# Patient Record
Sex: Male | Born: 1985 | Race: White | Hispanic: No | Marital: Single | Smoking: Current every day smoker
Health system: Southern US, Community
[De-identification: ages and names within clinical notes are randomized; demographics above are authoritative.]

## PROBLEM LIST (undated history)

## (undated) DIAGNOSIS — K649 Unspecified hemorrhoids: Secondary | ICD-10-CM

## (undated) DIAGNOSIS — J45909 Unspecified asthma, uncomplicated: Secondary | ICD-10-CM

## (undated) HISTORY — PX: OTHER SURGICAL HISTORY: SHX169

## (undated) HISTORY — PX: INNER EAR SURGERY: SHX679

---
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
## 2005-06-18 ENCOUNTER — Emergency Department (HOSPITAL_COMMUNITY): Admission: EM | Admit: 2005-06-18 | Discharge: 2005-06-18 | Payer: Self-pay | Admitting: Emergency Medicine

## 2005-06-19 ENCOUNTER — Emergency Department (HOSPITAL_COMMUNITY): Admission: EM | Admit: 2005-06-19 | Discharge: 2005-06-19 | Payer: Self-pay | Admitting: Emergency Medicine

## 2005-06-21 ENCOUNTER — Emergency Department (HOSPITAL_COMMUNITY): Admission: EM | Admit: 2005-06-21 | Discharge: 2005-06-21 | Payer: Self-pay | Admitting: Emergency Medicine

## 2005-07-05 ENCOUNTER — Emergency Department (HOSPITAL_COMMUNITY): Admission: EM | Admit: 2005-07-05 | Discharge: 2005-07-05 | Payer: Self-pay | Admitting: Emergency Medicine

## 2005-10-19 ENCOUNTER — Emergency Department (HOSPITAL_COMMUNITY): Admission: EM | Admit: 2005-10-19 | Discharge: 2005-10-19 | Payer: Self-pay | Admitting: Emergency Medicine

## 2006-09-20 ENCOUNTER — Emergency Department (HOSPITAL_COMMUNITY): Admission: EM | Admit: 2006-09-20 | Discharge: 2006-09-20 | Payer: Self-pay | Admitting: Emergency Medicine

## 2006-12-19 ENCOUNTER — Emergency Department (HOSPITAL_COMMUNITY): Admission: EM | Admit: 2006-12-19 | Discharge: 2006-12-20 | Payer: Self-pay | Admitting: Emergency Medicine

## 2007-06-05 ENCOUNTER — Emergency Department (HOSPITAL_COMMUNITY): Admission: EM | Admit: 2007-06-05 | Discharge: 2007-06-05 | Payer: Self-pay | Admitting: Emergency Medicine

## 2007-12-21 ENCOUNTER — Emergency Department (HOSPITAL_COMMUNITY): Admission: EM | Admit: 2007-12-21 | Discharge: 2007-12-21 | Payer: Self-pay | Admitting: Emergency Medicine

## 2008-04-04 IMAGING — CT CT MAXILLOFACIAL W/ CM
3 series · 17 of 47 positions shown, 20 images · IV contrast (omnipaque)
Comparison: 05/24/03 CT.

CLINICAL DATA: Right eye periorbital swelling.  Clinical concern for cellulitis.  
MAXILLOFACIAL CT WITH CONTRAST:
TECHNIQUE: Axial and coronal plane CT imaging was performed through the maxillofacial structures following intravenous contrast administration.
Contrast:  100 cc Omnipaque 300.

[Series 4: orbit/facial 2.0 h30s st · axial · 0.34mm/px · z∈[+1004,+1144]mm · 11 of 82 slices shown, 14 images]
[im 6/82  brain]
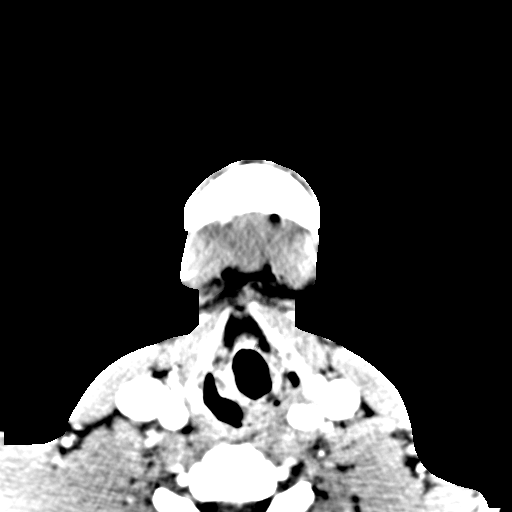
[im 6/82  bone]
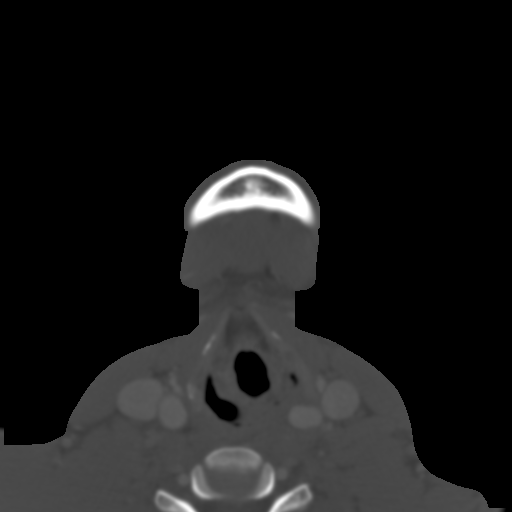
[im 12/82  bone]
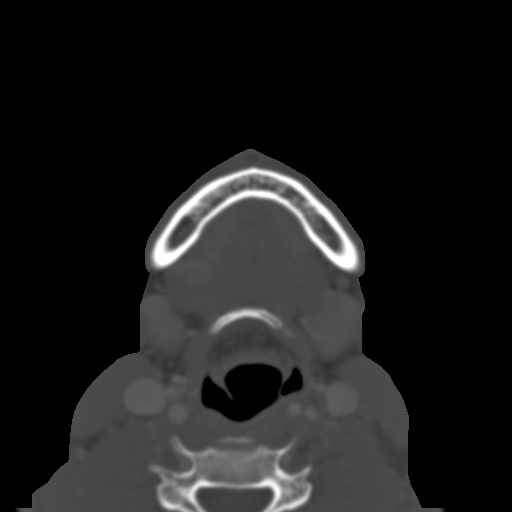
[im 20/82  bone]
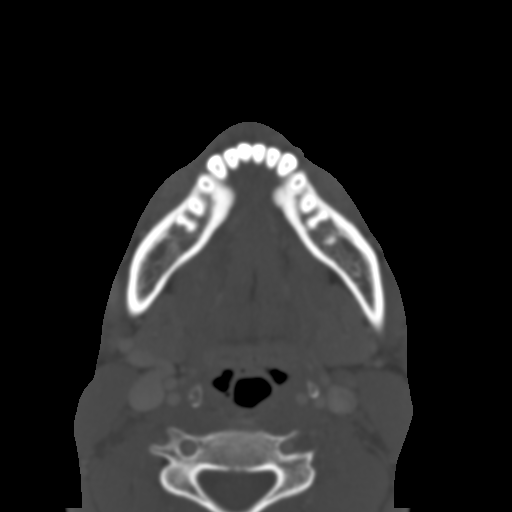
[im 26/82  bone]
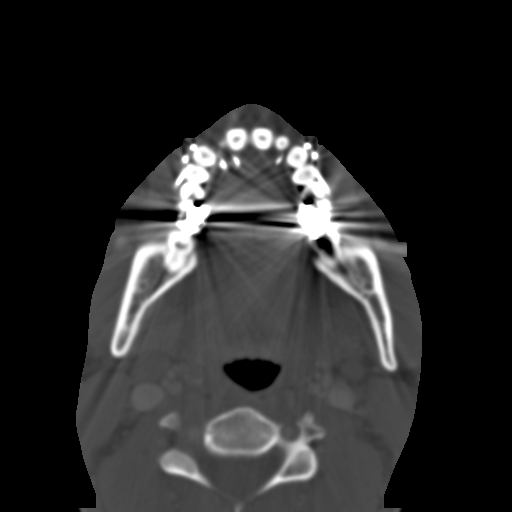
[im 34/82  brain]
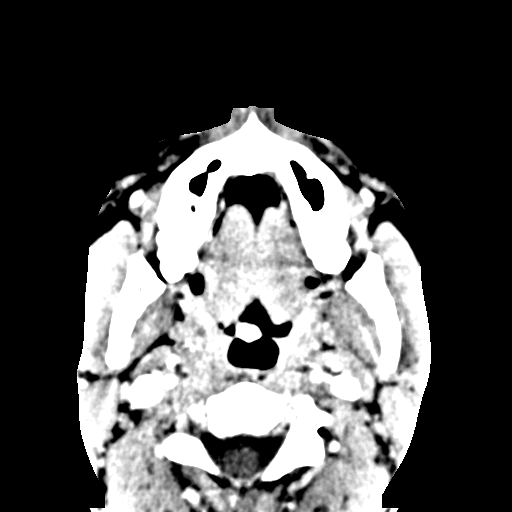
[im 34/82  bone]
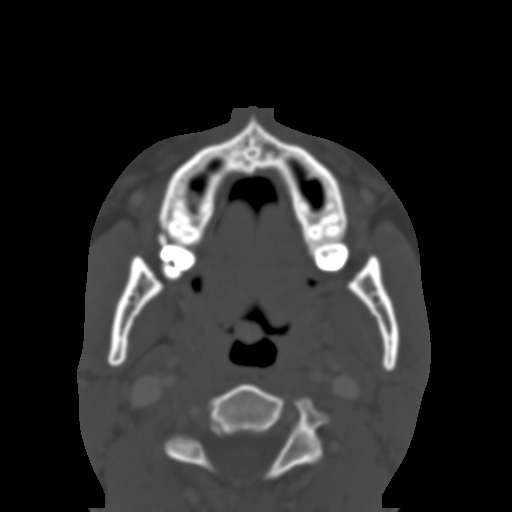
[im 42/82  bone]
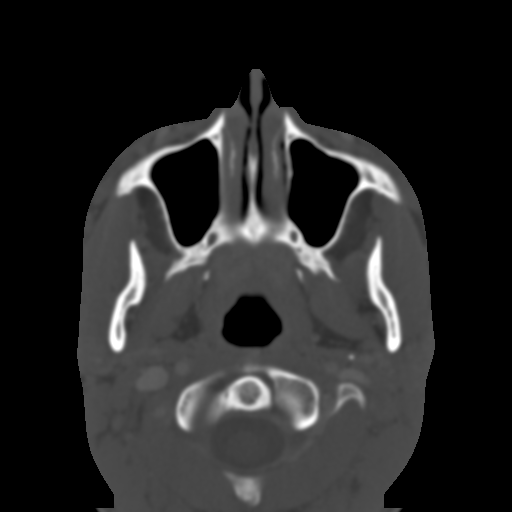
[im 48/82  bone]
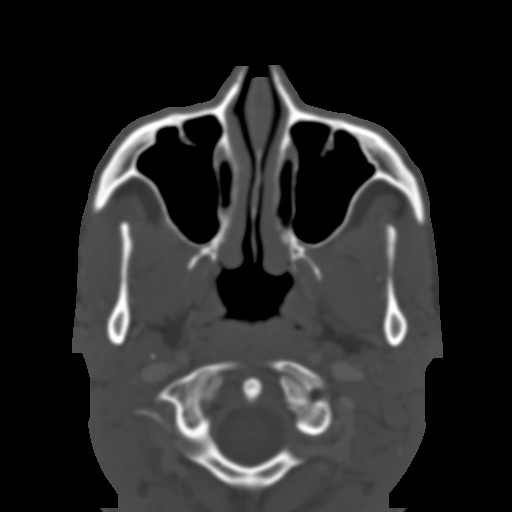
[im 56/82  bone]
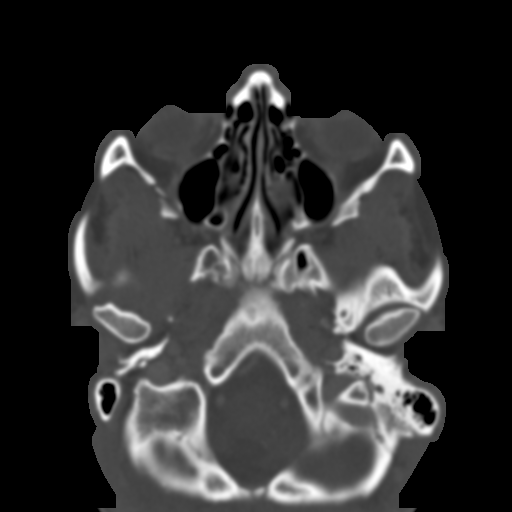
[im 62/82  brain]
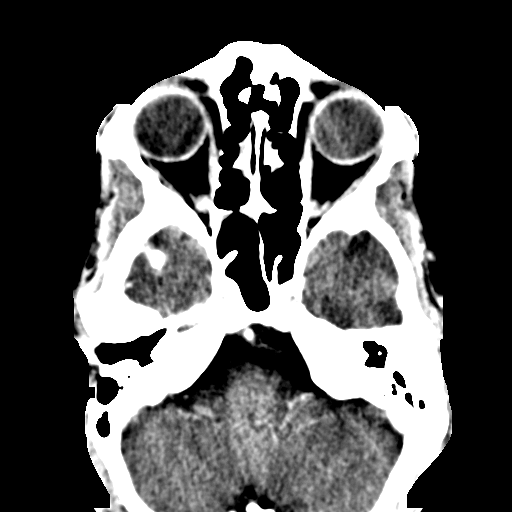
[im 62/82  bone]
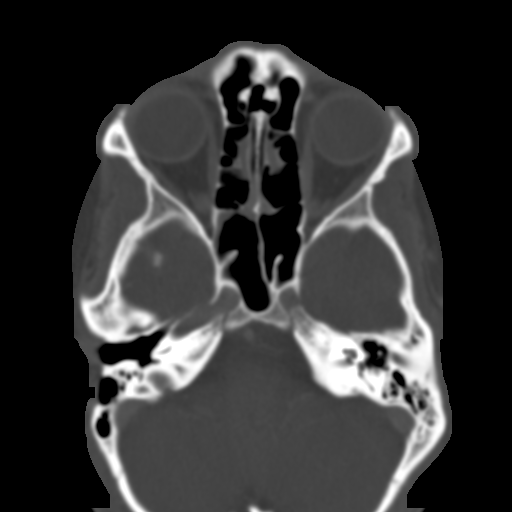
[im 70/82  bone]
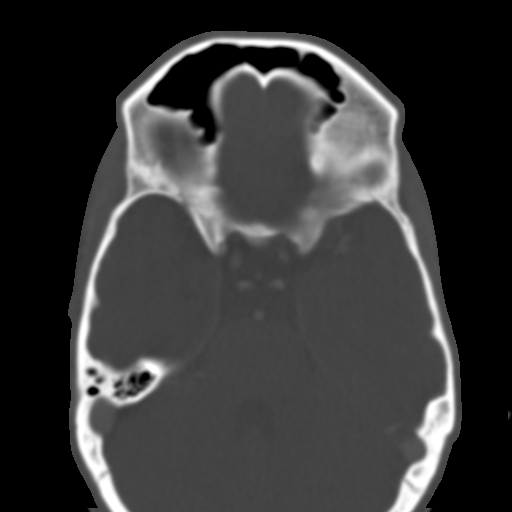
[im 76/82  bone]
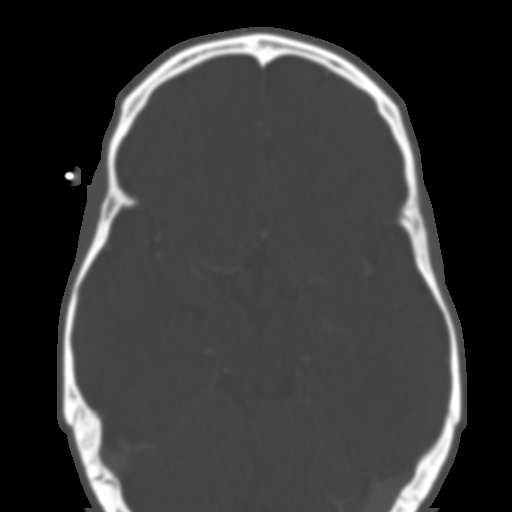

[Series 7: orbit/facial 2.0 spo · coronal · 0.36mm/px · 3 of 77 slices shown]
[im 26/77  bone]
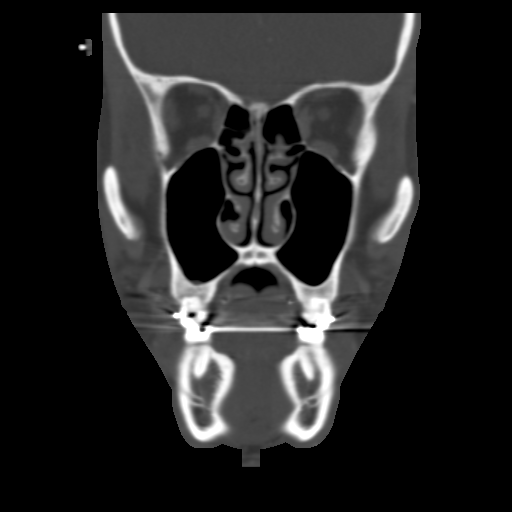
[im 34/77  bone]
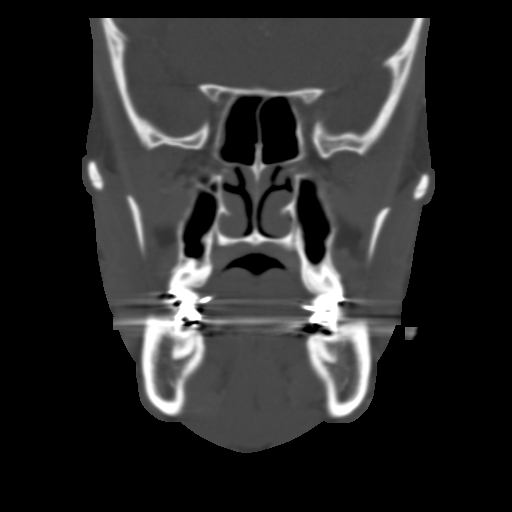
[im 43/77  bone]
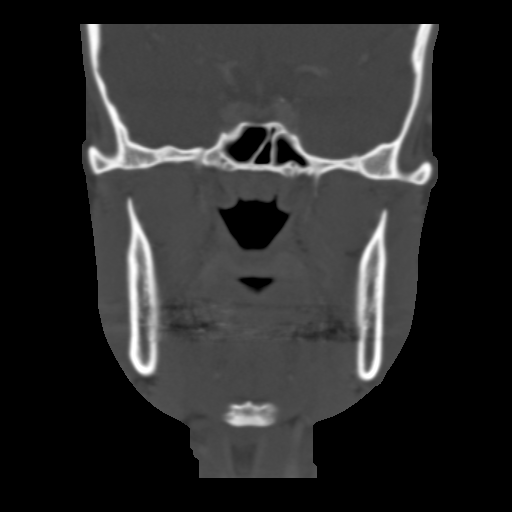

[Series 8: orbit/facial 2.0 spo sag sag · sagittal · 0.35mm/px · 3 of 73 slices shown]
[im 25/73  bone]
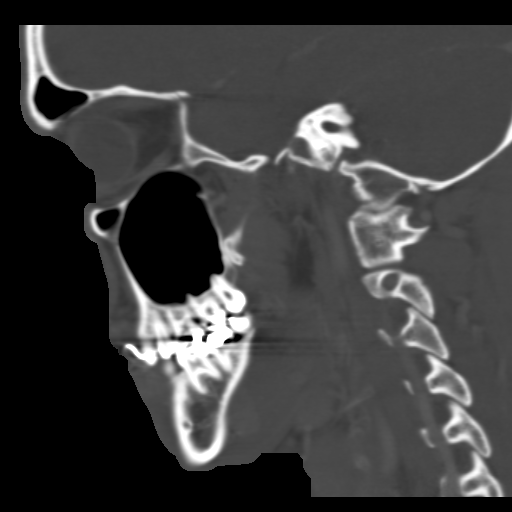
[im 37/73  bone]
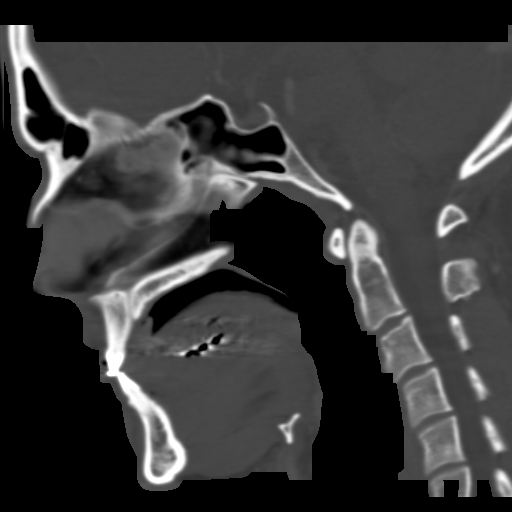
[im 49/73  bone]
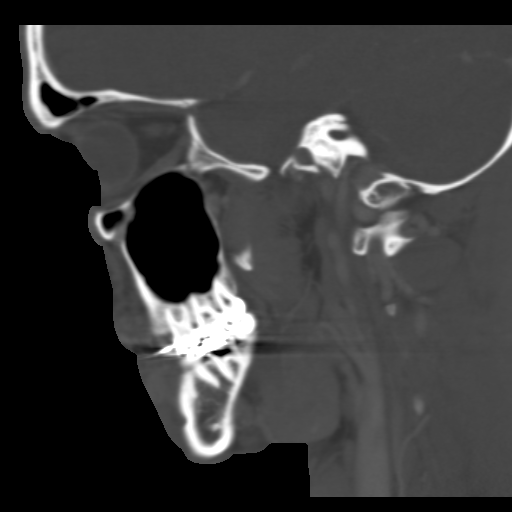

[17 of 47 positions shown; findings below may reference images not displayed]

FINDINGS: Orbits and paranasal sinuses are intact.  In particular, the intraconal fat in both orbits is clean without evidence of inflammation.  Minimal mucoperiosteal thickening of ethmoid and sphenoid sinusitis is noted.  No fracture.
IMPRESSION: 1.  No CT evidence for orbital cellulitis.  
2.  Minimal ethmoid and sphenoid sinusitis.

## 2008-04-12 ENCOUNTER — Emergency Department (HOSPITAL_COMMUNITY): Admission: EM | Admit: 2008-04-12 | Discharge: 2008-04-12 | Payer: Self-pay | Admitting: Family Medicine

## 2008-05-05 ENCOUNTER — Emergency Department (HOSPITAL_COMMUNITY): Admission: EM | Admit: 2008-05-05 | Discharge: 2008-05-05 | Payer: Self-pay | Admitting: Emergency Medicine

## 2010-02-11 ENCOUNTER — Inpatient Hospital Stay (INDEPENDENT_AMBULATORY_CARE_PROVIDER_SITE_OTHER)
Admission: RE | Admit: 2010-02-11 | Discharge: 2010-02-11 | Disposition: A | Discharge status: Home / Self Care | Payer: Self-pay | Source: Ambulatory Visit | Attending: Emergency Medicine | Admitting: Emergency Medicine

## 2010-02-11 DIAGNOSIS — IMO0002 Reserved for concepts with insufficient information to code with codable children: Secondary | ICD-10-CM

## 2010-02-14 LAB — CULTURE, ROUTINE-ABSCESS

## 2010-09-28 LAB — STREP A DNA PROBE: Group A Strep Probe: NEGATIVE

## 2010-10-06 LAB — DIFFERENTIAL
Basophils Relative: 1 % (ref 0–1)
Basophils Relative: 2 — ABNORMAL HIGH
Eosinophils Relative: 1 % (ref 0–5)
Eosinophils Relative: 3
Lymphocytes Relative: 25 % (ref 12–46)
Lymphocytes Relative: 30
Lymphs Abs: 2.8 10*3/uL (ref 0.7–4.0)
Lymphs Abs: 3.1
Monocytes Absolute: 0.8
Neutro Abs: 7.3 10*3/uL (ref 1.7–7.7)

## 2010-10-06 LAB — URINALYSIS, ROUTINE W REFLEX MICROSCOPIC
Bilirubin Urine: NEGATIVE
Glucose, UA: NEGATIVE mg/dL
Hgb urine dipstick: NEGATIVE
Ketones, ur: NEGATIVE mg/dL
Nitrite: NEGATIVE
Protein, ur: NEGATIVE mg/dL
Specific Gravity, Urine: 1.016 (ref 1.005–1.030)
Urobilinogen, UA: 1 mg/dL (ref 0.0–1.0)
pH: 7.5 (ref 5.0–8.0)

## 2010-10-06 LAB — CBC
HCT: 50.4 % (ref 39.0–52.0)
HCT: 45.1
Hemoglobin: 17.7 g/dL — ABNORMAL HIGH (ref 13.0–17.0)
Hemoglobin: 16
MCHC: 35.2 g/dL (ref 30.0–36.0)
MCHC: 35.6
MCV: 89.4 fL (ref 78.0–100.0)
MCV: 87.1
Platelets: 277 10*3/uL (ref 150–400)
Platelets: 260
RBC: 5.64 MIL/uL (ref 4.22–5.81)
RBC: 5.17
RDW: 12.8 % (ref 11.5–15.5)
RDW: 12.6
WBC: 10.9 10*3/uL — ABNORMAL HIGH (ref 4.0–10.5)
WBC: 10.4

## 2010-10-06 LAB — COMPREHENSIVE METABOLIC PANEL
AST: 21
Albumin: 4.2
Alkaline Phosphatase: 61
BUN: 8
Calcium: 9.1 mg/dL (ref 8.4–10.5)
Calcium: 9
Chloride: 104
Creatinine, Ser: 0.89 mg/dL (ref 0.4–1.5)
Creatinine, Ser: 0.84
GFR calc Af Amer: >60 mL/min (ref >60)
GFR calc non Af Amer: >60 mL/min (ref >60)
GFR calc non Af Amer: >60
Glucose, Bld: 84 mg/dL (ref 70–99)
Potassium: 3.8 mEq/L (ref 3.5–5.1)
Sodium: 139 mEq/L (ref 135–145)
Total Bilirubin: 1.4 mg/dL — ABNORMAL HIGH (ref 0.3–1.2)
Total Bilirubin: 1.1
Total Protein: 7 g/dL (ref 6.0–8.3)
Total Protein: 6.5

## 2010-10-12 LAB — DIFFERENTIAL
Eosinophils Absolute: 0.1
Lymphocytes Relative: 7 — ABNORMAL LOW

## 2010-10-12 LAB — URINALYSIS, ROUTINE W REFLEX MICROSCOPIC
Glucose, UA: NEGATIVE
Hgb urine dipstick: NEGATIVE
Protein, ur: NEGATIVE
Specific Gravity, Urine: 1.023

## 2010-10-12 LAB — CBC
MCHC: 35.7
MCV: 87.1
RBC: 5.06

## 2010-10-12 LAB — COMPREHENSIVE METABOLIC PANEL
Alkaline Phosphatase: 59
CO2: 23
Creatinine, Ser: 0.94
Total Bilirubin: 1.8 — ABNORMAL HIGH
Total Protein: 6.6

## 2010-12-27 ENCOUNTER — Emergency Department (HOSPITAL_COMMUNITY)
Admission: EM | Admit: 2010-12-27 | Discharge: 2010-12-27 | Disposition: A | Discharge status: Home / Self Care | Payer: Self-pay | Source: Home / Self Care | Attending: Family Medicine | Admitting: Family Medicine

## 2010-12-27 DIAGNOSIS — J069 Acute upper respiratory infection, unspecified: Secondary | ICD-10-CM

## 2010-12-27 MED ORDER — GUAIFENESIN-CODEINE 100-10 MG/5ML PO SYRP: 5.0000 mL | ORAL_SOLUTION | Freq: Four times a day (QID) | ORAL | Status: AC | PRN

## 2010-12-27 MED ORDER — AZITHROMYCIN 250 MG PO TABS: 250.0000 mg | ORAL_TABLET | Freq: Every day | ORAL | Status: AC

## 2010-12-27 NOTE — ED Provider Notes (Signed)
History     CSN: [id number]  Arrival date & time 12/27/10  1015   First MD Initiated Contact with Patient 12/27/10 1031      Chief Complaint  Patient presents with  . Fever    (Consider location/radiation/quality/duration/timing/severity/associated sxs/prior treatment) HPI Comments: George Mason presents for evaluation of persistent cough, fever, head congestion, rhinorrhea. He smokes less than a pack of cigarettes daily.   Patient is a 25 y.o. male presenting with cough. The history is provided by the patient.  Cough This is a new problem. The current episode started more than 2 days ago. The problem occurs constantly. The cough is non-productive. The maximum temperature recorded prior to his arrival was 100 to 100.9 F. Associated symptoms include chest pain, ear congestion, headaches, rhinorrhea and myalgias. Pertinent negatives include no chills and no sore throat. He has tried decongestants for the symptoms. The treatment provided no relief. He is a smoker.    History reviewed. No pertinent past medical history.  History reviewed. No pertinent past surgical history.  No family history on file.  History  Substance Use Topics  . Smoking status: Current Everyday Smoker  . Smokeless tobacco: Not on file  . Alcohol Use: No      Review of Systems  Constitutional: Positive for fever. Negative for chills.  HENT: Positive for congestion and rhinorrhea. Negative for sore throat.   Eyes: Negative.   Respiratory: Positive for cough.   Cardiovascular: Positive for chest pain.  Gastrointestinal: Negative.   Genitourinary: Negative.   Musculoskeletal: Positive for myalgias.  Skin: Negative.   Neurological: Positive for headaches.    Allergies  Penicillins  Home Medications   Current Outpatient Rx  Name Route Sig Dispense Refill  . AZITHROMYCIN 250 MG PO TABS Oral Take 1 tablet (250 mg total) by mouth daily. Take two tablets on first day, then one tablet each day for four days  6 tablet 0  . GUAIFENESIN-CODEINE 100-10 MG/5ML PO SYRP Oral Take 5 mLs by mouth every 6 (six) hours as needed for cough or congestion. 120 mL 0    BP 152/103  Pulse 74  Temp(Src) 98.8 F (37.1 C) (Oral)  Resp 20  SpO2 99%  Physical Exam  Nursing note and vitals reviewed. Constitutional: He is oriented to person, place, and time. He appears well-developed and well-nourished.  HENT:  Head: Normocephalic and atraumatic.  Right Ear: Tympanic membrane and external ear normal.  Left Ear: Tympanic membrane and external ear normal.  Mouth/Throat: Uvula is midline, oropharynx is clear and moist and mucous membranes are normal. No oropharyngeal exudate, posterior oropharyngeal edema or posterior oropharyngeal erythema.  Eyes: Conjunctivae and EOM are normal. Pupils are equal, round, and reactive to light.  Neck: Normal range of motion.  Cardiovascular: Normal rate and regular rhythm.   Pulmonary/Chest: Effort normal and breath sounds normal. He has no wheezes. He has no rhonchi. He has no rales.  Neurological: He is alert and oriented to person, place, and time.  Skin: Skin is warm and dry.    ED Course  Procedures (including critical care time)  Labs Reviewed - No data to display No results found.   1. URI (upper respiratory infection)       MDM  Treated with z-pack and guaifenesin AC        George Priest, MD 12/27/10 1321

## 2010-12-27 NOTE — ED Notes (Signed)
C/o 3 day duration of cough, body aches, chest tightness; hist of asthma as child, no wheezing noted on ascultation

## 2011-09-01 ENCOUNTER — Emergency Department (INDEPENDENT_AMBULATORY_CARE_PROVIDER_SITE_OTHER)
Admission: EM | Admit: 2011-09-01 | Discharge: 2011-09-01 | Disposition: A | Discharge status: Home / Self Care | Payer: Self-pay | Source: Home / Self Care | Attending: Emergency Medicine | Admitting: Emergency Medicine

## 2011-09-01 ENCOUNTER — Encounter (HOSPITAL_COMMUNITY): Payer: Self-pay | Admitting: Emergency Medicine

## 2011-09-01 DIAGNOSIS — R0982 Postnasal drip: Secondary | ICD-10-CM

## 2011-09-01 DIAGNOSIS — J302 Other seasonal allergic rhinitis: Secondary | ICD-10-CM

## 2011-09-01 DIAGNOSIS — J309 Allergic rhinitis, unspecified: Secondary | ICD-10-CM

## 2011-09-01 MED ORDER — FLUTICASONE PROPIONATE 50 MCG/ACT NA SUSP: 2.0000 | Freq: Every day | NASAL | Status: DC

## 2011-09-01 MED ORDER — IBUPROFEN 600 MG PO TABS: 600.0000 mg | ORAL_TABLET | Freq: Four times a day (QID) | ORAL | Status: AC | PRN

## 2011-09-01 MED ORDER — FEXOFENADINE-PSEUDOEPHED ER 180-240 MG PO TB24: 1.0000 | ORAL_TABLET | Freq: Every day | ORAL | Status: AC

## 2011-09-01 NOTE — ED Notes (Signed)
Pt c/o dry cough, headache, body ache, congestion, sore throat and pain on left ear x1 week... Denies fevers, vomiting, nausea and diarrhea.

## 2011-09-03 NOTE — ED Provider Notes (Signed)
History     CSN: [id number]  Arrival date & time 09/01/11  3086   First MD Initiated Contact with Patient 09/01/11 1956      Chief Complaint  Patient presents with  . Cough    (Consider location/radiation/quality/duration/timing/severity/associated sxs/prior treatment) HPI Comments: Patient reports sneezing, itchy, watery eyes, nasal congestion, clear rhinorrhea, malaise for the past week. Reports some left ear fullness. No nausea, vomiting, fevers, rash. Has a history of seasonal allergies. He is a smoker.  ROS as noted in HPI. All other ROS negative.   Patient is a 26 y.o. male presenting with cough. No language interpreter was used.  Cough This is a new problem. The current episode started more than 2 days ago. The problem occurs every few hours. The problem has not changed since onset.The cough is non-productive. Associated symptoms include ear congestion, ear pain, rhinorrhea, sore throat and eye redness. Pertinent negatives include no chest pain, no chills, no myalgias, no shortness of breath and no wheezing. He has tried decongestants for the symptoms. The treatment provided no relief. He is a smoker. His past medical history does not include asthma.    History reviewed. No pertinent past medical history.  Past Surgical History  Procedure Date  . Inner ear surgery     History reviewed. No pertinent family history.  History  Substance Use Topics  . Smoking status: Current Everyday Smoker -- 0.5 packs/day  . Smokeless tobacco: Not on file  . Alcohol Use: Yes      Review of Systems  Constitutional: Negative for chills.  HENT: Positive for ear pain, sore throat and rhinorrhea.   Eyes: Positive for redness.  Respiratory: Positive for cough. Negative for shortness of breath and wheezing.   Cardiovascular: Negative for chest pain.  Musculoskeletal: Negative for myalgias.    Allergies  Penicillins  Home Medications   Current Outpatient Rx  Name Route Sig  Dispense Refill  . ACETAMINOPHEN 325 MG PO TABS Oral Take 650 mg by mouth every 6 (six) hours as needed.    Marland Kitchen FEXOFENADINE-PSEUDOEPHED ER 180-240 MG PO TB24 Oral Take 1 tablet by mouth daily. 14 tablet 0  . FLUTICASONE PROPIONATE 50 MCG/ACT NA SUSP Nasal Place 2 sprays into the nose daily. 16 g 0  . IBUPROFEN 600 MG PO TABS Oral Take 1 tablet (600 mg total) by mouth every 6 (six) hours as needed for pain. 30 tablet 0    BP 128/82  Pulse 60  Temp 97.9 F (36.6 C) (Oral)  Resp 20  SpO2 100%  Physical Exam  Nursing note and vitals reviewed. Constitutional: He is oriented to person, place, and time. He appears well-developed and well-nourished.  HENT:  Head: Normocephalic and atraumatic. No trismus in the jaw.  Right Ear: Tympanic membrane normal.  Left Ear: Tympanic membrane normal.  Nose: Mucosal edema and rhinorrhea present.  Mouth/Throat: Uvula is midline and mucous membranes are normal. Posterior oropharyngeal erythema present. No oropharyngeal exudate.       Cobblestoned oropharynx, postnasal drip. No sinus tenderness  Eyes: Conjunctivae and EOM are normal. Pupils are equal, round, and reactive to light.  Neck: Normal range of motion. Neck supple.  Cardiovascular: Normal rate, regular rhythm and normal heart sounds.   Pulmonary/Chest: Effort normal and breath sounds normal.  Abdominal: Bowel sounds are normal. He exhibits no distension.  Musculoskeletal: Normal range of motion.  Lymphadenopathy:    He has no cervical adenopathy.  Neurological: He is alert and oriented to person, place, and time. No  cranial nerve deficit.  Skin: Skin is warm and dry. No rash noted.  Psychiatric: He has a normal mood and affect. His behavior is normal. Judgment and thought content normal.    ED Course  Procedures (including critical care time)  Labs Reviewed - No data to display No results found.   1. Seasonal allergies   2. Postnasal drip       MDM  Presentation consistent with  seasonal allergies. Home with nonsedating antihistamines, Flonase, saline nasal irrigation. Providing primary care resources for followup as needed.  Luiz Blare, MD 09/03/11 2233

## 2013-04-03 ENCOUNTER — Emergency Department (INDEPENDENT_AMBULATORY_CARE_PROVIDER_SITE_OTHER)
Admission: EM | Admit: 2013-04-03 | Discharge: 2013-04-03 | Disposition: A | Discharge status: Home / Self Care | Payer: Self-pay | Source: Home / Self Care | Attending: Emergency Medicine | Admitting: Emergency Medicine

## 2013-04-03 ENCOUNTER — Encounter (HOSPITAL_COMMUNITY): Payer: Self-pay | Admitting: Emergency Medicine

## 2013-04-03 DIAGNOSIS — K649 Unspecified hemorrhoids: Secondary | ICD-10-CM

## 2013-04-03 MED ORDER — HYDROCORTISONE ACETATE 25 MG RE SUPP: 25.0000 mg | Freq: Two times a day (BID) | RECTAL | Status: DC

## 2013-04-03 NOTE — Discharge Instructions (Signed)
You have bleeding from a hemorrhoid that is on the inside. You also have an external hemorrhoid, that may have bled some, but most appears to be from the inside.   For this you need to do the following things: 1. Use a steroid suppository twice a day for the next 3 days to decrease inflammation 2. Prevent constipation - drink clear fluids, use a daily fiber supplement, this softener like Colace as needed 3. Do not lift anything greater than 20 pounds for the next week  Please follow-up with this returns.   Dr. Clinton SawyerWilliamson

## 2013-04-03 NOTE — ED Notes (Signed)
States he noted bright blood from rectum earlier today; denies trauma or hard stool

## 2013-04-03 NOTE — ED Provider Notes (Signed)
Medical screening examination/treatment/procedure(s) were performed by a resident physician and as supervising physician I was immediately available for consultation/collaboration.  Leslee Homeavid Darnesha Diloreto, M.D.  Reuben Likesavid C Janel Beane, MD 04/03/13 [phone]

## 2013-04-03 NOTE — ED Provider Notes (Signed)
CSN: [id number]     Arrival date & time 04/03/13  1858 History   First MD Initiated Contact with Patient 04/03/13 1933     Chief Complaint  Patient presents with  . Rectal Bleeding   (Consider location/radiation/quality/duration/timing/severity/associated sxs/prior Treatment) Patient is a 28 y.o. male presenting with hematochezia.  Rectal Bleeding  Rectal bleeding. This started suddenly 3 hours ago. It occurred while he was doing abdominal crunches at the gym. It was painless. He described as a large amount. This is the first time he's had a problem with rectal bleeding. He denies any preceding abdominal pain, nausea, vomiting or diarrhea. He notes that he has had some constipation recently he does not have a bowel movement every day. He denies any rectal instrumentation.  Medical history - denies a history of Crohn's disease or celiac disease  History reviewed. No pertinent past medical history. Past Surgical History  Procedure Laterality Date  . Inner ear surgery     History reviewed. No pertinent family history. History  Substance Use Topics  . Smoking status: Current Every Day Smoker -- 0.50 packs/day  . Smokeless tobacco: Not on file  . Alcohol Use: Yes    Review of Systems  Gastrointestinal: Positive for hematochezia.  negative for fever, chills, nausea, vomiting, abdominal pain  Allergies  Penicillins  Home Medications   Current Outpatient Rx  Name  Route  Sig  Dispense  Refill  . acetaminophen (TYLENOL) 325 MG tablet   Oral   Take 650 mg by mouth every 6 (six) hours as needed.         Marland Kitchen. EXPIRED: fluticasone (FLONASE) 50 MCG/ACT nasal spray   Nasal   Place 2 sprays into the nose daily.   16 g   0   . hydrocortisone (ANUSOL-HC) 25 MG suppository   Rectal   Place 1 suppository (25 mg total) rectally 2 (two) times daily.   12 suppository   0    BP 139/85  Pulse 90  Temp(Src) 98.5 F (36.9 C) (Oral)  Resp 14  SpO2 100% Physical Exam Gen. A  well-appearing white male, mildly distressed Abdomen: nondistended nontender Rectal exam: one external hemorrhoid on the superior side with small hematoma that is nonbleeding, nontender Anoscopy: bleeding internal hemorrhoid in the inferior surface  ED Course  Procedures (including critical care time) Labs Review Labs Reviewed - No data to display Imaging Review No results found.   MDM   1. Bleeding hemorrhoid    Pt needs to prevent this with use of fiber and fluids to decrease constipation. Hydrocortisone suppositories also prescribed for potential improvement. Recommended to avoid strenuous activity for 1 week. Return PRN.     Garnetta BuddyEdward V Scicchitano, MD 04/03/13 2012

## 2013-09-05 ENCOUNTER — Encounter (HOSPITAL_BASED_OUTPATIENT_CLINIC_OR_DEPARTMENT_OTHER): Payer: Self-pay | Admitting: Emergency Medicine

## 2013-09-05 ENCOUNTER — Emergency Department (HOSPITAL_BASED_OUTPATIENT_CLINIC_OR_DEPARTMENT_OTHER)
Admission: EM | Admit: 2013-09-05 | Discharge: 2013-09-05 | Disposition: A | Discharge status: Home / Self Care | Payer: No Typology Code available for payment source | Attending: Emergency Medicine | Admitting: Emergency Medicine

## 2013-09-05 ENCOUNTER — Emergency Department (HOSPITAL_BASED_OUTPATIENT_CLINIC_OR_DEPARTMENT_OTHER): Payer: No Typology Code available for payment source

## 2013-09-05 DIAGNOSIS — Z880 Allergy status to penicillin: Secondary | ICD-10-CM | POA: Insufficient documentation

## 2013-09-05 DIAGNOSIS — Y9389 Activity, other specified: Secondary | ICD-10-CM | POA: Insufficient documentation

## 2013-09-05 DIAGNOSIS — S335XXA Sprain of ligaments of lumbar spine, initial encounter: Principal | ICD-10-CM

## 2013-09-05 DIAGNOSIS — Y92410 Unspecified street and highway as the place of occurrence of the external cause: Secondary | ICD-10-CM | POA: Insufficient documentation

## 2013-09-05 DIAGNOSIS — F17200 Nicotine dependence, unspecified, uncomplicated: Secondary | ICD-10-CM | POA: Insufficient documentation

## 2013-09-05 DIAGNOSIS — S39012A Strain of muscle, fascia and tendon of lower back, initial encounter: Secondary | ICD-10-CM

## 2013-09-05 DIAGNOSIS — IMO0002 Reserved for concepts with insufficient information to code with codable children: Secondary | ICD-10-CM | POA: Insufficient documentation

## 2013-09-05 DIAGNOSIS — S339XXA Sprain of unspecified parts of lumbar spine and pelvis, initial encounter: Secondary | ICD-10-CM | POA: Insufficient documentation

## 2013-09-05 MED ORDER — IBUPROFEN 800 MG PO TABS
800.0000 mg | ORAL_TABLET | Freq: Once | ORAL | Status: AC
  Administered 2013-09-05: 800 mg via ORAL
  Filled 2013-09-05: qty 1

## 2013-09-05 MED ORDER — METHOCARBAMOL 500 MG PO TABS: 500.0000 mg | ORAL_TABLET | Freq: Two times a day (BID) | ORAL | Status: DC | PRN

## 2013-09-05 NOTE — Discharge Instructions (Signed)
For pain control you may take up to 800mg of Motrin (also known as ibuprofen). That is usually 4 over the counter pills,  3 times a day. Take with food to minimize stomach irritation  ° °You can also take  tylenol (acetaminophen) 975mg (this is 3 over the counter pills) four times a day. Do not drink alcohol or combine with other medications that have acetaminophen as an ingredient (Read the labels!).   ° °For breakthrough pain you may take Robaxin. Do not drink alcohol, drive or operate heavy machinery when taking Robaxin. ° °Do not hesitate to return to the emergency room for any new, worsening or concerning symptoms. ° °Please obtain primary care using resource guide below. But the minute you were seen in the emergency room and that they will need to obtain records for further outpatient management. ° ° °Emergency Department Resource Guide °1) Find a Doctor and Pay Out of Pocket °Although you won't have to find out who is covered by your insurance plan, it is a good idea to ask around and get recommendations. You will then need to call the office and see if the doctor you have chosen will accept you as a new patient and what types of options they offer for patients who are self-pay. Some doctors offer discounts or will set up payment plans for their patients who do not have insurance, but you will need to ask so you aren't surprised when you get to your appointment. ° °2) Contact Your Local Health Department °Not all health departments have doctors that can see patients for sick visits, but many do, so it is worth a call to see if yours does. If you don't know where your local health department is, you can check in your phone book. The CDC also has a tool to help you locate your state's health department, and many state websites also have listings of all of their local health departments. ° °3) Find a Walk-in Clinic °If your illness is not likely to be very severe or complicated, you may want to try a walk in  clinic. These are popping up all over the country in pharmacies, drugstores, and shopping centers. They're usually staffed by nurse practitioners or physician assistants that have been trained to treat common illnesses and complaints. They're usually fairly quick and inexpensive. However, if you have serious medical issues or chronic medical problems, these are probably not your best option. ° °No Primary Care Doctor: °- Call Health Connect at  832-8000 - they can help you locate a primary care doctor that  accepts your insurance, provides certain services, etc. °- Physician Referral Service- 1-800-533-3463 ° °Chronic Pain Problems: °Organization         Address  Phone   Notes  ° [facility]  (336) 297-2271 Patients need to be referred by their primary care doctor.  ° °Medication Assistance: °Organization         Address  Phone   Notes  °[place] [facility] [address] °[place], [place] [address] (336) 641-8030 --Must be a resident of [place] °-- Must have NO insurance coverage whatsoever (no Medicaid/ Medicare, etc.) °-- The pt. MUST have a primary care doctor that directs their care regularly and follows them in the community °  °[facility]  (866) 331-1348   °[employer]  (888) 892-1162   ° °Agencies that provide inexpensive medical care: °Organization         Address  Phone     Notes  °[facility]  (336) 832-8035   °[facility]    (336) 832-7272   °[facility] [address] °[place], [place] [address] (336) 832-4777   °[facility] [address], °[place] (336) 271-4999   °[facility]    (336) 373-0678   °[facility]    (336) 272-1050   °[facility] ° [address], [place] Phone:  (336) 832-4444, Fax:  (336) 832-4440 Hours of Operation:  9 am - 6 pm, M-F.  Also accepts Medicaid/Medicare and self-pay.  °[facility] ° [address], [place] Phone: (336) [phone], Fax: (336) [phone]. Hours of Operation:  8:30 am - 5:30 pm, M-F.  Also accepts Medicaid and self-pay.  °[facility] [place] [address], [place] Phone: (336) 878-6027   °[facility] [address], [place], [place] (336)723-1848, Ext. 123 Mondays & Thursdays: 7-9 AM.  First 15 patients are seen on a first come, first serve basis. °  ° °Medicaid-accepting [place] Providers: ° °Organization         Address  Phone   Notes  °[facility] [address], [place] ([phone]) [phone] Also accepts self-pay patients.  °[facility] [address], [place] ° (336) 856-9996   °[facility] [address], [place] (336) 288-8857   °[facility] [address] [place] [address], [place] (336) 299-7000   °Veita Bland [address], [place]  ° (336) 373-1557 Only accepts [place] Access Medicaid patients after they have their name applied to their card.  ° °Self-Pay (no insurance) in [place]: ° °Organization         Address  Phone   Notes  °Sickle Cell Patients, [facility] [address], [place] (336) 832-1970   °[facility] [address], [place] (336) 832-4400   ° [facility] ° [address], [place] (336) 992-4800   °[facility]/Dr. Osei-Bonsu ° [address] [place] [address], [place] or [address], [place] (336) [phone] Phone number for both [place] and [place] locations is the same.  °[facility] [address], [place] (336) [phone]   °[facility] [address] [place] [address], [place] or [address] (336) 852-7530 °(336) 878-2260   °[facility] [address],  (336) 350-1642, phone; (336) 294-5005, fax Sees patients  1st and 3rd Saturday of every month.  Must not qualify for public or private insurance (i.e. Medicaid, Medicare, [place] Health Choice, Veterans' Benefits) • Household income should be no more than 200% of the poverty level •The clinic cannot treat you if you are pregnant or think you are pregnant • Sexually transmitted diseases are not treated at the clinic.  ° ° °Dental Care: °Organization         Address  Phone  Notes  °[place] [facility] [address],  (336) 641-6152 Accepts children up to age 21 who are enrolled in Medicaid or [place] Health Choice; pregnant women with a Medicaid card; and children who have applied for Medicaid or [place] Health Choice, but were declined, whose parents can pay a reduced fee at time of service.  °[place]   [facility]  [address], [place] [phone] Accepts children up to age 36 who are enrolled in [place] or [place]; pregnant women with a Medicaid card; and children who have applied for Medicaid or [place] Health Choice, but were declined, whose parents can pay a reduced fee at time of service.  [facility]  [place] [phone] Patients are seen by appointment only. Walk-ins are not accepted. [place] will see patients 32 years of age and older. Monday - Tuesday (8am-5pm) Most Wednesdays (8:30-5pm) $30 per visit, cash only  [facility]  [address], [facility] [phone] Patients are seen by appointment only. Walk-ins are not accepted. [place] will see patients 82 years of age and older. One Wednesday Evening (Monthly: Volunteer Based).  $30 per visit, cash only  [place]  [phone] for adults; Children under age 20, call Graduate Pediatric Dentistry at [phone]. Children aged 74-14, please call [phone] to request a  pediatric application.  Dental services are provided in all areas of dental care including fillings, crowns and bridges, complete and partial dentures, implants, gum treatment, root canals, and extractions. Preventive care is also provided. Treatment is provided to both adults and children. Patients are selected via a lottery and there is often a waiting list.   [facility] [address], [place]  [phone] [email or web address]   [facility] [address] [place], [place] [phone], Ext. 123 Second and Fourth Thursday of each month, opens at 6:30 AM; Clinic ends at 9 AM.  Patients are seen on a first-come first-served basis, and a limited number are seen during each clinic.   [facility]  [address] Hillard Danker [place], [place] [phone]   Eligibility Requirements You must have lived in [place], [place], or [place] counties for at least the last three months.   You cannot be eligible for state or federal sponsored [employer], including [employer], [place], or [employer].   You generally cannot be eligible for healthcare insurance through your employer.    How to apply: Eligibility screenings are held every Tuesday and Wednesday afternoon from 1:00 pm until 4:00 pm. You do not need an appointment for the interview!  [facility] [address], [place], [place]   [place]  [place] Department  [place]  [phone]    Behavioral Health Resources in the Community: Intensive Outpatient Programs Organization         Address  Phone  Notes  [facility] [address], [place], [place] [phone]) [phone]   [facility] [address] [place], [place]   [facility] [address] [place], [place]   [place] [address],  [place], [place] or (217)387-1286   Substance Abuse Resources Organization         Address  Phone  Notes  [facility]  867-450-3460   [place]  [phone]   [facility] [place]   Chinita Pester  [phone]   Residential & Outpatient Substance Abuse Program  (479)725-1356   Psychological Services Organization         Address  Phone  Notes  [facility]  [phone]   [facility]  5647971705   [facility] [address], [place] (870) 170-8020 or (985) 703-9650    Mobile Crisis Teams Organization         Address  Phone  Notes  [facility], Mobile Crisis Care Unit  (216) 218-5144   [facility]  [address] [place], [place] [phone]   Doristine Locks [address] [place] [phone]    Self-Help/Support Groups Organization         Address  Phone             Notes  [facility] [place] - variety of support groups  336- I7437963 Call for more information  Narcotics Anonymous (NA), [facility] [address], [employer] [place]  2 meetings at this location   Statistician         Address  Phone  Notes  [facility] [address],    [place]  [phone]   [facility]  [address], [place] [address], [place], [place] [phone]   [facility] [address] [place], [place] [phone] Admissions: 8am-3pm M-F  [facility] [address],    [place], [place] [phone]   [facility] [address] [place], [place], [place] [phone]   [facility] [address]  [place], [place] [phone]   [facility] - Intensive Outpatient [address], [place], [place] [phone]   [facility] [address]    [place], [place] [phone] or [phone]   [facility] ([facility]) [address] [place], [place] [phone] Accepts Medicaid  [facility] [address],  [place] [phone] Substance Abuse/Addiction Treatment   [facility] Organization         Address  Phone  Notes  [facility]  (604)[phone]   Angie Fava, PhD [address] Ervin Knack [place], [place]   [phone] or 250-065-6972   [facility]   [address] [place], [place] [phone]   [facility] [address] [place], [place] [phone] Insurance/Medicaid/sponsorship through [facility] and Families [address]                                    [place], [place] [phone] Therapy/tele-psych/case  [facility] [address], [place] [phone]    Dr. Lolly Mustache  [phone]   [facility]  [employer] [facility] Dept. 1) [address], [place] 2) [address], [place] 3)  [address], [place] ([phone]) [phone]  ([phone]   [facility] Child Abuse Hotline 772-541-0977 or (939)024-8358 (After Hours)       Motor Vehicle Collision It is common to have multiple bruises and sore muscles after a motor vehicle collision (MVC). These tend to feel worse for the first 24 hours. You may have the most stiffness and soreness over the first several hours. You may also feel worse when you wake up the first morning after your collision. After this point, you will usually begin to improve with each day. The speed of improvement often depends on the severity of the collision, the number of  injuries, and the location and nature of these injuries. HOME CARE INSTRUCTIONS  Put ice on the injured area.  Put ice in a plastic bag.  Place a towel between your skin and the bag.  Leave the ice on for 15-20 minutes, 3-4 times a day, or as  directed by your health care provider.  Drink enough fluids to keep your urine clear or pale yellow. Do not drink alcohol.  Take a warm shower or bath once or twice a day. This will increase blood flow to sore muscles.  You may return to activities as directed by your caregiver. Be careful when lifting, as this may aggravate neck or back pain.  Only take over-the-counter or prescription medicines for pain, discomfort, or fever as directed by your caregiver. Do not use aspirin. This may increase bruising and bleeding. SEEK IMMEDIATE MEDICAL CARE IF:  You have numbness, tingling, or weakness in the arms or legs.  You develop severe headaches not relieved with medicine.  You have severe neck pain, especially tenderness in the middle of the back of your neck.  You have changes in bowel or bladder control.  There is increasing pain in any area of the body.  You have shortness of breath, light-headedness, dizziness, or fainting.  You have chest pain.  You feel sick to your stomach (nauseous), throw up (vomit), or sweat.  You have increasing abdominal discomfort.  There is blood in your urine, stool, or vomit.  You have pain in your shoulder (shoulder strap areas).  You feel your symptoms are getting worse. MAKE SURE YOU:  Understand these instructions.  Will watch your condition.  Will get help right away if you are not doing well or get worse. Document Released: 12/18/2004 Document Revised: 05/04/2013 Document Reviewed: 05/17/2010 [facility] Patient Information 2015 [place], [place]. This information is not intended to replace advice given to you by your health care provider. Make sure you discuss any questions you have with your health care provider.
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
## 2013-09-05 NOTE — ED Notes (Signed)
Patient discharged to home with self. NAD.

## 2013-09-05 NOTE — ED Provider Notes (Signed)
Medical screening examination/treatment/procedure(s) were performed by non-physician practitioner and as supervising physician I was immediately available for consultation/collaboration.   EKG Interpretation None       Doug Sou, MD 09/05/13 1438
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
## 2014-07-27 ENCOUNTER — Encounter (HOSPITAL_COMMUNITY): Payer: Self-pay | Admitting: Emergency Medicine

## 2014-07-27 ENCOUNTER — Emergency Department (HOSPITAL_COMMUNITY)
Admission: EM | Admit: 2014-07-27 | Discharge: 2014-07-28 | Disposition: A | Discharge status: Home / Self Care | Payer: No Typology Code available for payment source | Attending: Emergency Medicine | Admitting: Emergency Medicine

## 2014-07-27 DIAGNOSIS — J45909 Unspecified asthma, uncomplicated: Secondary | ICD-10-CM | POA: Insufficient documentation

## 2014-07-27 DIAGNOSIS — B360 Pityriasis versicolor: Secondary | ICD-10-CM | POA: Diagnosis not present

## 2014-07-27 DIAGNOSIS — R109 Unspecified abdominal pain: Secondary | ICD-10-CM | POA: Diagnosis present

## 2014-07-27 DIAGNOSIS — Z7951 Long term (current) use of inhaled steroids: Secondary | ICD-10-CM | POA: Insufficient documentation

## 2014-07-27 DIAGNOSIS — Z720 Tobacco use: Secondary | ICD-10-CM | POA: Insufficient documentation

## 2014-07-27 DIAGNOSIS — Z880 Allergy status to penicillin: Secondary | ICD-10-CM | POA: Insufficient documentation

## 2014-07-27 HISTORY — DX: Unspecified asthma, uncomplicated: J45.909

## 2014-07-27 LAB — CBC
HCT: 44.1 % (ref 39.0–52.0)
HEMOGLOBIN: 15.9 g/dL (ref 13.0–17.0)
MCH: 31.5 pg (ref 26.0–34.0)
MCHC: 36.1 g/dL — ABNORMAL HIGH (ref 30.0–36.0)
MCV: 87.5 fL (ref 78.0–100.0)
Platelets: 239 10*3/uL (ref 150–400)
RBC: 5.04 MIL/uL (ref 4.22–5.81)
RDW: 12.2 % (ref 11.5–15.5)
WBC: 9 10*3/uL (ref 4.0–10.5)

## 2014-07-27 NOTE — ED Notes (Signed)
Pt states he is having pain in both sides for the past 4 to 5 nights  Pt states he does drink daily and the pain is worse when he drinks  Pt states he will drink at least a beer or two daily  Pt states he got really stressed out around Christmas and that's when he started drinking  Pt states a lot happened then that caused him to have a lot of stress  Pt states he also has been getting "tan spots" come up on his chest and abdomen lately

## 2014-07-28 LAB — COMPREHENSIVE METABOLIC PANEL
ALT: 23 U/L (ref 17–63)
ANION GAP: 7 (ref 5–15)
AST: 21 U/L (ref 15–41)
Albumin: 4.3 g/dL (ref 3.5–5.0)
Alkaline Phosphatase: 61 U/L (ref 38–126)
BUN: 16 mg/dL (ref 6–20)
CALCIUM: 9.5 mg/dL (ref 8.9–10.3)
CO2: 25 mmol/L (ref 22–32)
CREATININE: 0.94 mg/dL (ref 0.61–1.24)
Chloride: 108 mmol/L (ref 101–111)
GFR calc Af Amer: >60 mL/min (ref >60)
Glucose, Bld: 89 mg/dL (ref 65–99)
Potassium: 3.6 mmol/L (ref 3.5–5.1)
Sodium: 140 mmol/L (ref 135–145)
TOTAL PROTEIN: 7.2 g/dL (ref 6.5–8.1)
Total Bilirubin: 0.9 mg/dL (ref 0.3–1.2)

## 2014-07-28 LAB — URINALYSIS, ROUTINE W REFLEX MICROSCOPIC
Bilirubin Urine: NEGATIVE
GLUCOSE, UA: NEGATIVE mg/dL
HGB URINE DIPSTICK: NEGATIVE
Ketones, ur: NEGATIVE mg/dL
LEUKOCYTES UA: NEGATIVE
Nitrite: NEGATIVE
Protein, ur: NEGATIVE mg/dL
Specific Gravity, Urine: 1.025 (ref 1.005–1.030)
Urobilinogen, UA: 1 mg/dL (ref 0.0–1.0)
pH: 5.5 (ref 5.0–8.0)

## 2014-07-28 LAB — ETHANOL: Alcohol, Ethyl (B): <5 mg/dL (ref <5)

## 2014-07-28 LAB — LIPASE, BLOOD: LIPASE: 22 U/L (ref 22–51)

## 2014-07-28 MED ORDER — SELENIUM SULFIDE 2.5 % EX LOTN: 1.0000 "application " | TOPICAL_LOTION | Freq: Every day | CUTANEOUS | Status: DC

## 2014-07-28 MED ORDER — IBUPROFEN 800 MG PO TABS: 800.0000 mg | ORAL_TABLET | Freq: Three times a day (TID) | ORAL | Status: DC | PRN

## 2014-07-28 NOTE — ED Provider Notes (Signed)
CSN: [id number]     Arrival date & time 07/27/14  2211 History  This chart was scribed for Marisa Severin, MD by Doreatha Martin, ED Scribe. This patient was seen in room WA17/WA17 and the patient's care was started at 1:08 AM.     Chief Complaint  Patient presents with  . Abdominal Pain   The history is provided by the patient. No language interpreter was used.    HPI Comments: George Mason is a 29 y.o. male who presents to the Emergency Department complaining of moderate, intermittent, sharp bilateral upper side pain (worse on the right side) onset 3 months ago and worsened 3 days ago. Pt states that his episodes of pain generally last for about an hour. He states associated trouble sleeping, tan spots on his chest that appeared 3 months ago and have been gradually getting darker. He states that he had an episode of emesis 3 weeks ago and noticed blood described as "cherry red". Pt has taken Tylenol at home with mild relief. Pt notes that he is a current drinker (1-9 12oz beers a day). He states that on average, he consumes 2-3 beers a night. Pt smokes 0.5 ppd. Pt also notes frequent energy drink consumption. He denies weight changes, urinary symptoms, SOB, constipation, diarrhea, changes in bowel habits, bloody stool, fever, chills, itching, nausea, recent episodes of emesis. He also denies cocaine, marijuana, LSD or heroin use.  Past Medical History  Diagnosis Date  . Asthma    Past Surgical History  Procedure Laterality Date  . Inner ear surgery    . Eardrum surgery     Family History  Problem Relation Age of Onset  . Hypertension Other   . Diabetes Other   . Cancer Other    History  Substance Use Topics  . Smoking status: Current Every Day Smoker -- 0.50 packs/day  . Smokeless tobacco: Not on file  . Alcohol Use: Yes     Comment: daily     Review of Systems  Constitutional: Negative for fever, chills and unexpected weight change.  Respiratory: Negative for shortness of  breath.   Gastrointestinal: Positive for abdominal pain. Negative for nausea, vomiting, diarrhea, constipation and blood in stool.  Genitourinary: Negative for dysuria, urgency, frequency and hematuria.  Skin: Positive for color change.  Psychiatric/Behavioral: Positive for sleep disturbance.  All other systems reviewed and are negative.  Allergies  Penicillins  Home Medications   Prior to Admission medications   Medication Sig Start Date End Date Taking? Authorizing Provider  acetaminophen (TYLENOL) 325 MG tablet Take 650 mg by mouth every 6 (six) hours as needed.    Historical Provider, MD  fluticasone (FLONASE) 50 MCG/ACT nasal spray Place 2 sprays into the nose daily. 09/01/11 08/31/12  Domenick Gong, MD  hydrocortisone (ANUSOL-HC) 25 MG suppository Place 1 suppository (25 mg total) rectally 2 (two) times daily. 04/03/13   Garnetta Buddy, MD  methocarbamol (ROBAXIN) 500 MG tablet Take 1 tablet (500 mg total) by mouth 2 (two) times daily as needed for muscle spasms. 09/05/13   Nicole Pisciotta, PA-C   BP 136/83 mmHg  Pulse 73  Temp(Src) 98.7 F (37.1 C) (Oral)  Resp 20  SpO2 98% Physical Exam  Constitutional: He is oriented to person, place, and time. He appears well-developed and well-nourished.  HENT:  Head: Normocephalic and atraumatic.  Nose: Nose normal.  Mouth/Throat: Oropharynx is clear and moist.  Eyes: Conjunctivae and EOM are normal. Pupils are equal, round, and reactive to light.  Neck: Normal range of motion. Neck supple. No JVD present. No tracheal deviation present. No thyromegaly present.  Cardiovascular: Normal rate, regular rhythm, normal heart sounds and intact distal pulses.  Exam reveals no gallop and no friction rub.   No murmur heard. Pulmonary/Chest: Effort normal and breath sounds normal. No stridor. No respiratory distress. He has no wheezes. He has no rales. He exhibits no tenderness.  Abdominal: Soft. Bowel sounds are normal. He exhibits no  distension and no mass. There is tenderness (patient has tenderness along lateral abdomen bilaterally). There is no rebound and no guarding.  Musculoskeletal: Normal range of motion. He exhibits no edema or tenderness.  Lymphadenopathy:    He has no cervical adenopathy.  Neurological: He is alert and oriented to person, place, and time. He displays normal reflexes. He exhibits normal muscle tone. Coordination normal.  Skin: Skin is warm and dry. Rash (patient with several flat, dark patches to chest and back.  Areas are not scaly, no erythema, no induration) noted. No erythema. No pallor.  Psychiatric: He has a normal mood and affect. His behavior is normal. Judgment and thought content normal.  Nursing note and vitals reviewed.    ED Course  Procedures (including critical care time) DIAGNOSTIC STUDIES: Oxygen Saturation is 98% on RA, normal by my interpretation.    COORDINATION OF CARE: 1:17 AM Discussed treatment plan with pt at bedside and pt agreed to plan.   Labs Review Labs Reviewed  CBC - Abnormal; Notable for the following:    MCHC 36.1 (*)    All other components within normal limits  LIPASE, BLOOD  COMPREHENSIVE METABOLIC PANEL  URINALYSIS, ROUTINE W REFLEX MICROSCOPIC (NOT AT [facility])  ETHANOL    Imaging Review No results found.   EKG Interpretation None      MDM   Final diagnoses:  Tinea versicolor  Bilateral flank pain   I personally performed the services described in this documentation, which was scribed in my presence. The recorded information has been reviewed and is accurate.  29 year old male with 3 months of bilateral flank pain, as well as 3 months of rash to chest and back.  Patient has heavy to moderate alcohol use.  On exam, tenderness along muscles bilaterally and flank.  No pain with deep palpation of abdomen.  Rash is consistent with tinea versicolor.  Workup otherwise unremarkable.  Patient instructed to back off on alcohol consumption, start  anti-inflammatory.  Selsun Blue for tinea versicolor.  Marisa Severin, MD 07/28/14 [phone]

## 2014-07-28 NOTE — ED Notes (Signed)
Patient has brown spots on chest with abdominal pain.

## 2014-07-28 NOTE — Discharge Instructions (Signed)
Your workup has not shown a specific cause for the pain on your sides.  Take ibuprofen as prescribed.  Cut back on your alcohol intake.  Follow-up with her primary care doctor if symptoms not improving.  The rash on your skin is due to tinea versicolor.  Use the Selsun shampoo daily for 2 weeks.   Flank Pain Flank pain refers to pain that is located on the side of the body between the upper abdomen and the back. The pain may occur over a short period of time (acute) or may be long-term or reoccurring (chronic). It may be mild or severe. Flank pain can be caused by many things. CAUSES  Some of the more common causes of flank pain include:  Muscle strains.   Muscle spasms.   A disease of your spine (vertebral disk disease).   A lung infection (pneumonia).   Fluid around your lungs (pulmonary edema).   A kidney infection.   Kidney stones.   A very painful skin rash caused by the chickenpox virus (shingles).   Gallbladder disease.  HOME CARE INSTRUCTIONS  Home care will depend on the cause of your pain. In general,  Rest as directed by your caregiver.  Drink enough fluids to keep your urine clear or pale yellow.  Only take over-the-counter or prescription medicines as directed by your caregiver. Some medicines may help relieve the pain.  Tell your caregiver about any changes in your pain.  Follow up with your caregiver as directed. SEEK IMMEDIATE MEDICAL CARE IF:   Your pain is not controlled with medicine.   You have new or worsening symptoms.  Your pain increases.   You have abdominal pain.   You have shortness of breath.   You have persistent nausea or vomiting.   You have swelling in your abdomen.   You feel faint or pass out.   You have blood in your urine.  You have a fever or persistent symptoms for more than 2-3 days.  You have a fever and your symptoms suddenly get worse. MAKE SURE YOU:   Understand these instructions.  Will watch  your condition.  Will get help right away if you are not doing well or get worse. Document Released: 02/08/2005 Document Revised: 09/12/2011 Document Reviewed: 08/02/2011 [facility] Patient Information 2015 [place], [place]. This information is not intended to replace advice given to you by your health care provider. Make sure you discuss any questions you have with your health care provider.  Tinea Versicolor Tinea versicolor is a common yeast infection of the skin. This condition becomes known when the yeast on our skin starts to overgrow (yeast is a normal inhabitant on our skin). This condition is noticed as white or light brown patches on brown skin, and is more evident in the summer on tanned skin. These areas are slightly scaly if scratched. The light patches from the yeast become evident when the yeast creates "holes in your suntan". This is most often noticed in the summer. The patches are usually located on the chest, back, pubis, neck and body folds. However, it may occur on any area of body. Mild itching and inflammation (redness or soreness) may be present. DIAGNOSIS  The diagnosisof this is made clinically (by looking). Cultures from samples are usually not needed. Examination under the microscope may help. However, yeast is normally found on skin. The diagnosis still remains clinical. Examination under Wood's Ultraviolet Light can determine the extent of the infection. TREATMENT  This common infection is usually only  of cosmetic (only a concern to your appearance). It is easily treated with dandruff shampoo used during showers or bathing. Vigorous scrubbing will eliminate the yeast over several days time. The light areas in your skin may remain for weeks or months after the infection is cured unless your skin is exposed to sunlight. The lighter or darker spots caused by the fungus that remain after complete treatment are not a sign of treatment failure; it will take a long time to resolve.  Your caregiver may recommend a number of commercial preparations or medication by mouth if home care is not working. Recurrence is common and preventative medication may be necessary. This skin condition is not highly contagious. Special care is not needed to protect close friends and family members. Normal hygiene is usually enough. Follow up is required only if you develop complications (such as a secondary infection from scratching), if recommended by your caregiver, or if no relief is obtained from the preparations used. Document Released: 12/16/1999 Document Revised: 03/12/2011 Document Reviewed: 01/28/2008 [facility] Patient Information 2015 [place], [place]. This information is not intended to replace advice given to you by your health care provider. Make sure you discuss any questions you have with your health care provider.

## 2014-07-28 NOTE — ED Notes (Signed)
Pt cannot use restroom at this time, Aware urine specimen is needed.

## 2015-02-03 ENCOUNTER — Emergency Department (INDEPENDENT_AMBULATORY_CARE_PROVIDER_SITE_OTHER)
Admission: EM | Admit: 2015-02-03 | Discharge: 2015-02-03 | Disposition: A | Discharge status: Home / Self Care | Payer: Managed Care, Other (non HMO) | Source: Home / Self Care | Attending: Emergency Medicine | Admitting: Emergency Medicine

## 2015-02-03 ENCOUNTER — Encounter (HOSPITAL_COMMUNITY): Payer: Self-pay | Admitting: Emergency Medicine

## 2015-02-03 ENCOUNTER — Emergency Department (INDEPENDENT_AMBULATORY_CARE_PROVIDER_SITE_OTHER): Payer: Managed Care, Other (non HMO)

## 2015-02-03 ENCOUNTER — Emergency Department (HOSPITAL_COMMUNITY): Payer: Managed Care, Other (non HMO)

## 2015-02-03 DIAGNOSIS — R0781 Pleurodynia: Secondary | ICD-10-CM

## 2015-02-03 MED ORDER — OXYCODONE-ACETAMINOPHEN 5-325 MG PO TABS: 1.0000 | ORAL_TABLET | Freq: Four times a day (QID) | ORAL | Status: DC | PRN

## 2015-02-03 NOTE — ED Notes (Signed)
Sharp, shooting pain in right chest.  Pain occurs with twisting of torso or use of right arm, cough, sneeze, deep inspiration, or turning over in bed.  Onset Monday after a fall.  Slipped on ice, flat ground.  When patient landed, right torso hit ground and right arm was raised.  Reports bruise to right hip.  No bruise to right ribcage.  Patient was wearing a jacket when he fell.

## 2015-02-03 NOTE — ED Provider Notes (Signed)
CSN: [id number]     Arrival date & time 02/03/15  1319 History   First MD Initiated Contact with Patient 02/03/15 1411     No chief complaint on file.  (Consider location/radiation/quality/duration/timing/severity/associated sxs/prior Treatment) HPI History obtained from patient:   LOCATION:righ side SEVERITY:4 DURATION:Injury Monday CONTEXT:slipped and fell at gas station QUALITY: MODIFYING FACTORS:OTC meds and whiskey ASSOCIATED SYMPTOMS: pain radiates to chest from back TIMING: constant OCCUPATION:  Past Medical History  Diagnosis Date  . Asthma    Past Surgical History  Procedure Laterality Date  . Inner ear surgery    . Eardrum surgery     Family History  Problem Relation Age of Onset  . Hypertension Other   . Diabetes Other   . Cancer Other    Social History  Substance Use Topics  . Smoking status: Current Every Day Smoker -- 0.50 packs/day  . Smokeless tobacco: None  . Alcohol Use: Yes     Comment: daily     Review of Systems ROS +'ve  DENIES; CHANGE IN ACTIVITY, CONGESTION, HEADACHE, CHEST PAIN, ABDOMINAL PAIN, SHORTNESS OF BREATH, WHEEZING, EXCESSIVE THIRST OR URINATION, SKIN RASH, DIFFICULTY WITH URINATION, DYSURIA, AGITATION, BALANCE ISSUES  Allergies  Penicillins  Home Medications   Prior to Admission medications   Medication Sig Start Date End Date Taking? Authorizing Provider  acetaminophen (TYLENOL) 325 MG tablet Take 650 mg by mouth every 6 (six) hours as needed for mild pain.    Yes Historical Provider, MD  fluticasone (FLONASE) 50 MCG/ACT nasal spray Place 2 sprays into the nose daily. Patient not taking: Reported on 07/28/2014 09/01/11 08/31/12  Domenick Gong, MD  hydrocortisone (ANUSOL-HC) 25 MG suppository Place 1 suppository (25 mg total) rectally 2 (two) times daily. Patient not taking: Reported on 07/28/2014 04/03/13   Garnetta Buddy, MD  ibuprofen (ADVIL,MOTRIN) 800 MG tablet Take 1 tablet (800 mg total) by mouth every 8 (eight)  hours as needed for mild pain, moderate pain or cramping. 07/28/14   Marisa Severin, MD  methocarbamol (ROBAXIN) 500 MG tablet Take 1 tablet (500 mg total) by mouth 2 (two) times daily as needed for muscle spasms. Patient not taking: Reported on 07/28/2014 09/05/13   Joni Reining Pisciotta, PA-C  selenium sulfide (SELSUN) 2.5 % shampoo Apply 1 application topically daily. X 2 weeks 07/28/14   Marisa Severin, MD   Meds Ordered and Administered this Visit  Medications - No data to display  BP 117/81 mmHg  Pulse 57  Temp(Src) 97.4 F (36.3 C) (Oral)  Resp 16  SpO2 99% No data found.   Physical Exam  Constitutional: He is oriented to person, place, and time. He appears well-developed and well-nourished. No distress.  HENT:  Head: Normocephalic and atraumatic.  Eyes: Conjunctivae are normal.  Neck: Normal range of motion. Neck supple.  Cardiovascular: Normal rate.   Pulmonary/Chest: Effort normal. No respiratory distress. He has no wheezes.   He exhibits tenderness.    Abdominal: Soft. Bowel sounds are normal.  Neurological: He is alert and oriented to person, place, and time.  Skin: Skin is warm and dry.  Psychiatric: He has a normal mood and affect. His behavior is normal.  Nursing note and vitals reviewed.   ED Course  Procedures (including critical care time)  Labs Review Labs Reviewed - No data to display  Imaging Review Dg Ribs Unilateral W/chest Right  02/03/2015  CLINICAL DATA:  [place] upper chest, RIGHT upper quadrant pain, shortness of breath follow  by pain with inspiration, smoker, history child asthma EXAM: RIGHT RIBS AND CHEST - 3+ VIEW COMPARISON:  10/19/2005 FINDINGS: Normal heart size, mediastinal contours, and pulmonary vascularity. Lungs hyperinflated but clear. No pulmonary infiltrate, pleural effusion, or pneumothorax. Osseous mineralization normal. No rib fracture or bone destruction. IMPRESSION: Hyperinflated lungs without infiltrate.  No acute RIGHT rib abnormalities identified. Electronically Signed   By: Ulyses Southward M.D.   On: 02/03/2015 15:19     Visual Acuity Review  Right Eye Distance:   Left Eye Distance:   Bilateral Distance:    Right Eye Near:   Left Eye Near:    Bilateral Near:        Pt discharged home by myself. Nurse informed.  MDM   1. Rib pain on right side    Patient is advised to continue home symptomatic treatment. Prescription for percocet sent pharmacy patient has indicated. Patient is advised that if there are new or worsening symptoms or attend the emergency department, or contact primary care provider. Instructions of care provided discharged home in stable condition. Return to work/school note provided.  THIS NOTE WAS GENERATED USING A VOICE RECOGNITION SOFTWARE PROGRAM. ALL REASONABLE EFFORTS  WERE MADE TO PROOFREAD THIS DOCUMENT FOR ACCURACY.     Tharon Aquas, PA 02/03/15 1540

## 2015-02-03 NOTE — Discharge Instructions (Signed)
Chest Wall Pain °Chest wall pain is pain in or around the bones and muscles of your chest. Sometimes, an injury causes this pain. Sometimes, the cause may not be known. This pain may take several weeks or longer to get better. °HOME CARE °Pay attention to any changes in your symptoms. Take these actions to help with your pain: °· Rest as told by your doctor. °· Avoid activities that cause pain. Try not to use your chest, belly (abdominal), or side muscles to lift heavy things. °· If directed, apply ice to the painful area: °¨ Put ice in a plastic bag. °¨ Place a towel between your skin and the bag. °¨ Leave the ice on for 20 minutes, 2-3 times per day. °· Take over-the-counter and prescription medicines only as told by your doctor. °· Do not use tobacco products, including cigarettes, chewing tobacco, and e-cigarettes. If you need help quitting, ask your doctor. °· Keep all follow-up visits as told by your doctor. This is important. °GET HELP IF: °· You have a fever. °· Your chest pain gets worse. °· You have new symptoms. °GET HELP RIGHT AWAY IF: °· You feel sick to your stomach (nauseous) or you throw up (vomit). °· You feel sweaty or light-headed. °· You have a cough with phlegm (sputum) or you cough up blood. °· You are short of breath. °  °This information is not intended to replace advice given to you by your health care provider. Make sure you discuss any questions you have with your health care provider. °  °Document Released: 06/06/2007 Document Revised: 09/08/2014 Document Reviewed: 03/15/2014 °Elsevier Interactive Patient Education ©2016 [employer] ° °

## 2015-07-04 ENCOUNTER — Encounter (HOSPITAL_COMMUNITY): Payer: Self-pay | Admitting: Emergency Medicine

## 2015-07-04 ENCOUNTER — Ambulatory Visit (HOSPITAL_COMMUNITY)
Admission: EM | Admit: 2015-07-04 | Discharge: 2015-07-04 | Disposition: A | Discharge status: Home / Self Care | Payer: Managed Care, Other (non HMO) | Attending: Emergency Medicine | Admitting: Emergency Medicine

## 2015-07-04 DIAGNOSIS — K649 Unspecified hemorrhoids: Secondary | ICD-10-CM | POA: Diagnosis not present

## 2015-07-04 HISTORY — DX: Unspecified hemorrhoids: K64.9

## 2015-07-04 LAB — POCT I-STAT, CHEM 8
BUN: 11 mg/dL (ref 6–20)
CHLORIDE: 104 mmol/L (ref 101–111)
Calcium, Ion: 1.14 mmol/L (ref 1.13–1.30)
Creatinine, Ser: 0.9 mg/dL (ref 0.61–1.24)
Glucose, Bld: 80 mg/dL (ref 65–99)
HEMATOCRIT: 48 % (ref 39.0–52.0)
Hemoglobin: 16.3 g/dL (ref 13.0–17.0)
POTASSIUM: 4 mmol/L (ref 3.5–5.1)
Sodium: 139 mmol/L (ref 135–145)
TCO2: 22 mmol/L (ref 0–100)

## 2015-07-04 MED ORDER — METOCLOPRAMIDE HCL 5 MG/ML IJ SOLN: 5.0000 mg | Freq: Once | INTRAMUSCULAR | Status: DC

## 2015-07-04 MED ORDER — KETOROLAC TROMETHAMINE 60 MG/2ML IM SOLN: 60.0000 mg | Freq: Once | INTRAMUSCULAR | Status: DC

## 2015-07-04 MED ORDER — DEXAMETHASONE SODIUM PHOSPHATE 10 MG/ML IJ SOLN: 10.0000 mg | Freq: Once | INTRAMUSCULAR | Status: DC

## 2015-07-04 MED ORDER — SUMATRIPTAN SUCCINATE 6 MG/0.5ML ~~LOC~~ SOLN: 6.0000 mg | Freq: Once | SUBCUTANEOUS | Status: DC

## 2015-07-04 NOTE — ED Provider Notes (Signed)
CSN: [id number]     Arrival date & time 07/04/15  1550 History   None    Chief Complaint  Patient presents with  . Hemorrhoids   (Consider location/radiation/quality/duration/timing/severity/associated sxs/prior Treatment)  HPI   The patient is a 30 year old male presenting tonight with complaints of a bleeding hemorrhoid 5 days. Patient states he has a history of hemorrhoids for which he has increased fiber and fluids in his diet but is not using any other treatments. Denies significant medical history or allergies to medications. LBM yesterday.    Past Medical History  Diagnosis Date  . Asthma   . Hemorrhoid    Past Surgical History  Procedure Laterality Date  . Inner ear surgery    . Eardrum surgery     Family History  Problem Relation Age of Onset  . Hypertension Other   . Diabetes Other   . Cancer Other    Social History  Substance Use Topics  . Smoking status: Current Every Day Smoker -- 0.50 packs/day  . Smokeless tobacco: None  . Alcohol Use: Yes     Comment: daily     Review of Systems  Constitutional: Negative.   HENT: Negative.   Eyes: Negative.   Respiratory: Negative.   Cardiovascular: Negative.   Gastrointestinal: Positive for anal bleeding and rectal pain. Negative for nausea, vomiting, abdominal pain, diarrhea, constipation, blood in stool and abdominal distention.  Endocrine: Negative.   Genitourinary: Negative.   Musculoskeletal: Negative.   Skin: Negative.   Allergic/Immunologic: Negative.   Neurological: Negative.   Hematological: Negative.   Psychiatric/Behavioral: Negative.     Allergies  Penicillins  Home Medications   Prior to Admission medications   Medication Sig Start Date End Date Taking? Authorizing Provider  acetaminophen (TYLENOL) 325 MG tablet Take 650 mg by mouth every 6 (six) hours as needed for mild pain.     Historical Provider, MD  fluticasone (FLONASE) 50 MCG/ACT nasal spray Place 2 sprays into the nose  daily. Patient not taking: Reported on 07/28/2014 09/01/11 08/31/12  Domenick GongAshley Mortenson, MD  hydrocortisone (ANUSOL-HC) 25 MG suppository Place 1 suppository (25 mg total) rectally 2 (two) times daily. Patient not taking: Reported on 07/28/2014 04/03/13   Garnetta BuddyEdward V Kirt, MD  ibuprofen (ADVIL,MOTRIN) 800 MG tablet Take 1 tablet (800 mg total) by mouth every 8 (eight) hours as needed for mild pain, moderate pain or cramping. 07/28/14   Marisa Severinlga Otter, MD  methocarbamol (ROBAXIN) 500 MG tablet Take 1 tablet (500 mg total) by mouth 2 (two) times daily as needed for muscle spasms. Patient not taking: Reported on 07/28/2014 09/05/13   Joni ReiningNicole Pisciotta, PA-C  oxyCODONE-acetaminophen (PERCOCET/ROXICET) 5-325 MG tablet Take 1 tablet by mouth every 6 (six) hours as needed for severe pain. 02/03/15   Tharon AquasFrank C Patrick, PA  selenium sulfide (SELSUN) 2.5 % shampoo Apply 1 application topically daily. X 2 weeks 07/28/14   Marisa Severinlga Otter, MD   Meds Ordered and Administered this Visit  Medications - No data to display  BP 107/72 mmHg  Pulse 65  Temp(Src) 97.6 F (36.4 C) (Oral)  Resp 16  SpO2 96% No data found.   Physical Exam  Constitutional: He appears well-developed and well-nourished. No distress.  Cardiovascular: Normal rate, regular rhythm, normal heart sounds and intact distal pulses.  Exam reveals no gallop and no friction rub.   No murmur heard. Pulmonary/Chest: Effort normal and breath sounds normal. No respiratory distress. He has no wheezes. He has no rales. He exhibits no tenderness.  Abdominal:  Soft. Bowel sounds are normal. He exhibits no distension and no mass. There is no tenderness. There is no rebound and no guarding.  The patient has an external hemorrhoid approximately 0.75cm with an abraided area visible.  No active bleeding noted internally or externally. Anoscopy deferred for patient comfort and likelihood of aggravating external hemorrhoid.    Skin: Skin is warm and dry. He is not diaphoretic.   Nursing note and vitals reviewed.   ED Course  Procedures (including critical care time)  Labs Review Labs Reviewed  POCT I-STAT, CHEM 8   Results for orders placed or performed during the hospital encounter of 07/04/15  I-STAT, chem 8  Result Value Ref Range   Sodium 139 135 - 145 mmol/L   Potassium 4.0 3.5 - 5.1 mmol/L   Chloride 104 101 - 111 mmol/L   BUN 11 6 - 20 mg/dL   Creatinine, Ser 1.610.90 0.61 - 1.24 mg/dL   Glucose, Bld 80 65 - 99 mg/dL   Calcium, Ion 0.961.14 0.451.13 - 1.30 mmol/L   TCO2 22 0 - 100 mmol/L   Hemoglobin 16.3 13.0 - 17.0 g/dL   HCT 40.948.0 81.139.0 - 91.452.0 %    Imaging Review No results found.    MDM   1. Bleeding hemorrhoid    Referred to gastroenterology for further follow-up. Patient is to take Miralax daily and increase his water intake. In addition, suggested Tucks medicated pads and Colace as needed for comfort. The patient verbalizes understanding and agrees to plan of care.       Servando Salinaatherine H Rossi, NP 07/04/15 1746  Servando Salinaatherine H Rossi, NP 07/04/15 1747

## 2015-07-04 NOTE — ED Notes (Signed)
Patient has a history of hemorrhoids.  Patient has noticed bleeding since Thursday.  Reports bleeding was decreasing until today.  Patient rested over the weekend, returned to work yesterday.  Patient reports no blood on clothing, but he can feel the blood and when he wipes there is blood.  Patient reports pain.  Patient reports last bm was yesterday and loose.

## 2015-07-04 NOTE — Discharge Instructions (Signed)
You need to try to increase your daily water intake.  Take Miralax fiber supplement daily to help with stool softening.  You can also purchase some over the counter COLACE and TUCKS medicated pads for stool softening and comfort.  [facility] with [facility].   Hemorrhoids Hemorrhoids are swollen veins around the rectum or anus. There are two types of hemorrhoids:   Internal hemorrhoids. These occur in the veins just inside the rectum. They may poke through to the outside and become irritated and painful.  External hemorrhoids. These occur in the veins outside the anus and can be felt as a painful swelling or hard lump near the anus. CAUSES  Pregnancy.   Obesity.   Constipation or diarrhea.   Straining to have a bowel movement.   Sitting for long periods on the toilet.  Heavy lifting or other activity that caused you to strain.  Anal intercourse. SYMPTOMS   Pain.   Anal itching or irritation.   Rectal bleeding.   Fecal leakage.   Anal swelling.   One or more lumps around the anus.  DIAGNOSIS  Your caregiver may be able to diagnose hemorrhoids by visual examination. Other examinations or tests that may be performed include:   Examination of the rectal area with a gloved hand (digital rectal exam).   Examination of anal canal using a small tube (scope).   A blood test if you have lost a significant amount of blood.  A test to look inside the colon (sigmoidoscopy or colonoscopy). TREATMENT Most hemorrhoids can be treated at home. However, if symptoms do not seem to be getting better or if you have a lot of rectal bleeding, your caregiver may perform a procedure to help make the hemorrhoids get smaller or remove them completely. Possible treatments include:   Placing a rubber band at the base of the hemorrhoid to cut off the circulation (rubber band ligation).   Injecting a chemical to shrink the hemorrhoid (sclerotherapy).   Using a tool to  burn the hemorrhoid (infrared light therapy).   Surgically removing the hemorrhoid (hemorrhoidectomy).   Stapling the hemorrhoid to block blood flow to the tissue (hemorrhoid stapling).  HOME CARE INSTRUCTIONS   Eat foods with fiber, such as whole grains, beans, nuts, fruits, and vegetables. Ask your doctor about taking products with added fiber in them (fibersupplements).  Increase fluid intake. Drink enough water and fluids to keep your urine clear or pale yellow.   Exercise regularly.   Go to the bathroom when you have the urge to have a bowel movement. Do not wait.   Avoid straining to have bowel movements.   Keep the anal area dry and clean. Use wet toilet paper or moist towelettes after a bowel movement.   Medicated creams and suppositories may be used or applied as directed.   Only take over-the-counter or prescription medicines as directed by your caregiver.   Take warm sitz baths for 15-20 minutes, 3-4 times a day to ease pain and discomfort.   Place ice packs on the hemorrhoids if they are tender and swollen. Using ice packs between sitz baths may be helpful.   Put ice in a plastic bag.   Place a towel between your skin and the bag.   Leave the ice on for 15-20 minutes, 3-4 times a day.   Do not use a donut-shaped pillow or sit on the toilet for long periods. This increases blood pooling and pain.  SEEK MEDICAL CARE IF:  You have increasing  pain and swelling that is not controlled by treatment or medicine.  You have uncontrolled bleeding.  You have difficulty or you are unable to have a bowel movement.  You have pain or inflammation outside the area of the hemorrhoids. MAKE SURE YOU:  Understand these instructions.  Will watch your condition.  Will get help right away if you are not doing well or get worse.   This information is not intended to replace advice given to you by your health care provider. Make sure you discuss any questions  you have with your health care provider.   Document Released: 12/16/1999 Document Revised: 12/05/2011 Document Reviewed: 10/23/2011 Elsevier Interactive Patient Education [employer]2016 Elsevier Inc.

## 2015-12-22 ENCOUNTER — Emergency Department (HOSPITAL_COMMUNITY): Payer: BLUE CROSS/BLUE SHIELD

## 2015-12-22 ENCOUNTER — Encounter (HOSPITAL_COMMUNITY): Payer: Self-pay | Admitting: Vascular Surgery

## 2015-12-22 ENCOUNTER — Emergency Department (HOSPITAL_COMMUNITY)
Admission: EM | Admit: 2015-12-22 | Discharge: 2015-12-22 | Disposition: A | Discharge status: Home / Self Care | Payer: BLUE CROSS/BLUE SHIELD | Attending: Emergency Medicine | Admitting: Emergency Medicine

## 2015-12-22 DIAGNOSIS — E876 Hypokalemia: Secondary | ICD-10-CM | POA: Diagnosis not present

## 2015-12-22 DIAGNOSIS — J45909 Unspecified asthma, uncomplicated: Secondary | ICD-10-CM | POA: Insufficient documentation

## 2015-12-22 DIAGNOSIS — Z79899 Other long term (current) drug therapy: Secondary | ICD-10-CM | POA: Insufficient documentation

## 2015-12-22 DIAGNOSIS — F17200 Nicotine dependence, unspecified, uncomplicated: Secondary | ICD-10-CM | POA: Diagnosis not present

## 2015-12-22 DIAGNOSIS — R1031 Right lower quadrant pain: Secondary | ICD-10-CM | POA: Diagnosis present

## 2015-12-22 DIAGNOSIS — N3090 Cystitis, unspecified without hematuria: Secondary | ICD-10-CM | POA: Diagnosis not present

## 2015-12-22 LAB — CBC
HEMATOCRIT: 46 % (ref 39.0–52.0)
Hemoglobin: 16.5 g/dL (ref 13.0–17.0)
MCH: 31.6 pg (ref 26.0–34.0)
MCHC: 35.9 g/dL (ref 30.0–36.0)
MCV: 88.1 fL (ref 78.0–100.0)
Platelets: 237 10*3/uL (ref 150–400)
RBC: 5.22 MIL/uL (ref 4.22–5.81)
RDW: 12.8 % (ref 11.5–15.5)
WBC: 10.3 10*3/uL (ref 4.0–10.5)

## 2015-12-22 LAB — COMPREHENSIVE METABOLIC PANEL
ALBUMIN: 4.7 g/dL (ref 3.5–5.0)
ALT: 33 U/L (ref 17–63)
AST: 33 U/L (ref 15–41)
Alkaline Phosphatase: 60 U/L (ref 38–126)
Anion gap: 11 (ref 5–15)
BILIRUBIN TOTAL: 0.7 mg/dL (ref 0.3–1.2)
BUN: 9 mg/dL (ref 6–20)
CO2: 24 mmol/L (ref 22–32)
Calcium: 9.5 mg/dL (ref 8.9–10.3)
Chloride: 101 mmol/L (ref 101–111)
Creatinine, Ser: 0.99 mg/dL (ref 0.61–1.24)
GFR calc Af Amer: >60 mL/min (ref >60)
GFR calc non Af Amer: >60 mL/min (ref >60)
GLUCOSE: 136 mg/dL — AB (ref 65–99)
Potassium: 2.9 mmol/L — ABNORMAL LOW (ref 3.5–5.1)
SODIUM: 136 mmol/L (ref 135–145)
Total Protein: 6.9 g/dL (ref 6.5–8.1)

## 2015-12-22 LAB — URINALYSIS, ROUTINE W REFLEX MICROSCOPIC
Bilirubin Urine: NEGATIVE
Glucose, UA: NEGATIVE mg/dL
Hgb urine dipstick: NEGATIVE
Ketones, ur: NEGATIVE mg/dL
LEUKOCYTES UA: NEGATIVE
Nitrite: NEGATIVE
PH: 7 (ref 5.0–8.0)
Protein, ur: NEGATIVE mg/dL
Specific Gravity, Urine: 1.017 (ref 1.005–1.030)

## 2015-12-22 LAB — LIPASE, BLOOD: Lipase: 59 U/L — ABNORMAL HIGH (ref 11–51)

## 2015-12-22 MED ORDER — SODIUM CHLORIDE 0.9 % IV BOLUS (SEPSIS)
1000.0000 mL | Freq: Once | INTRAVENOUS | Status: AC
  Administered 2015-12-22: 1000 mL via INTRAVENOUS

## 2015-12-22 MED ORDER — POTASSIUM CHLORIDE CRYS ER 20 MEQ PO TBCR
40.0000 meq | EXTENDED_RELEASE_TABLET | Freq: Once | ORAL | Status: AC
  Administered 2015-12-22: 40 meq via ORAL
  Filled 2015-12-22: qty 2

## 2015-12-22 MED ORDER — ONDANSETRON HCL 4 MG/2ML IJ SOLN
4.0000 mg | Freq: Once | INTRAMUSCULAR | Status: AC
  Administered 2015-12-22: 4 mg via INTRAVENOUS
  Filled 2015-12-22: qty 2

## 2015-12-22 MED ORDER — IOPAMIDOL (ISOVUE-300) INJECTION 61%
INTRAVENOUS | Status: AC
  Administered 2015-12-22: 100 mL
  Filled 2015-12-22: qty 100

## 2015-12-22 MED ORDER — CEPHALEXIN 250 MG PO CAPS
500.0000 mg | ORAL_CAPSULE | Freq: Once | ORAL | Status: AC
Start: 2015-12-22 — End: 2015-12-22
  Administered 2015-12-22: 500 mg via ORAL
  Filled 2015-12-22: qty 2

## 2015-12-22 MED ORDER — HYDROCODONE-ACETAMINOPHEN 5-325 MG PO TABS: 1.0000 | ORAL_TABLET | Freq: Four times a day (QID) | ORAL | 0 refills | Status: DC | PRN

## 2015-12-22 MED ORDER — CEPHALEXIN 500 MG PO CAPS: 500.0000 mg | ORAL_CAPSULE | Freq: Two times a day (BID) | ORAL | 0 refills | Status: DC

## 2015-12-22 NOTE — ED Provider Notes (Signed)
[facility] Provider Note   CSN: [id number] Arrival date & time: 12/22/15  0228  History   Chief Complaint Chief Complaint  Patient presents with  . Flank Pain    HPI Lilla ShookSteven O Adolph is a 30 y.o. male.  HPI   Pt has PMH of asthma and hemorrhoids. He comes to the ER for evaluation of 1 week of intermittent becoming more constant RLQ abdominal pain. Its worse after he eats. He says that tonight at work he had a pain so severe it almost brought him to his knees and he wanted to ball up. He has not had nausea, vomiting or diarrhea. He has had normal bowel movements. He  Denies every having this problem in the pain, no abdominal surgeries.  Past Medical History:  Diagnosis Date  . Asthma   . Hemorrhoid     There are no active problems to display for this patient.   Past Surgical History:  Procedure Laterality Date  . eardrum surgery    . INNER EAR SURGERY         Home Medications    Prior to Admission medications   Medication Sig Start Date End Date Taking? Authorizing Provider  acetaminophen (TYLENOL) 325 MG tablet Take 650 mg by mouth every 6 (six) hours as needed for mild pain.     Historical Provider, MD  fluticasone (FLONASE) 50 MCG/ACT nasal spray Place 2 sprays into the nose daily. Patient not taking: Reported on 07/28/2014 09/01/11 08/31/12  Domenick GongAshley Mortenson, MD  hydrocortisone (ANUSOL-HC) 25 MG suppository Place 1 suppository (25 mg total) rectally 2 (two) times daily. Patient not taking: Reported on 07/28/2014 04/03/13   Garnetta BuddyEdward V Glaze, MD  ibuprofen (ADVIL,MOTRIN) 800 MG tablet Take 1 tablet (800 mg total) by mouth every 8 (eight) hours as needed for mild pain, moderate pain or cramping. 07/28/14   Marisa Severinlga Otter, MD  methocarbamol (ROBAXIN) 500 MG tablet Take 1 tablet (500 mg total) by mouth 2 (two) times daily as needed for muscle spasms. Patient not taking: Reported on 07/28/2014 09/05/13   Joni ReiningNicole Pisciotta, PA-C  oxyCODONE-acetaminophen  (PERCOCET/ROXICET) 5-325 MG tablet Take 1 tablet by mouth every 6 (six) hours as needed for severe pain. 02/03/15   Tharon AquasFrank C Patrick, PA  selenium sulfide (SELSUN) 2.5 % shampoo Apply 1 application topically daily. X 2 weeks 07/28/14   Marisa Severinlga Otter, MD    Family History Family History  Problem Relation Age of Onset  . Hypertension Other   . Diabetes Other   . Cancer Other     Social History Social History  Substance Use Topics  . Smoking status: Current Every Day Smoker    Packs/day: 0.50  . Smokeless tobacco: Never Used  . Alcohol use Yes     Comment: daily      Allergies   Penicillins   Review of Systems Review of Systems   Physical Exam Updated Vital Signs BP 137/98 (BP Location: Right Arm)   Pulse 71   Temp 97.9 F (36.6 C) (Oral)   Resp 16   Ht 5\' 8"  (1.727 m)   Wt 65.6 kg   SpO2 99%   BMI 22.00 kg/m   Physical Exam  Constitutional: He is oriented to person, place, and time. He appears well-developed and well-nourished. No distress.  HENT:  Head: Normocephalic and atraumatic.  Nose: Nose normal.  Mouth/Throat: Uvula is midline, oropharynx is clear and moist and mucous membranes are normal.  Eyes: EOM are normal. Pupils are equal, round, and reactive to  light.  Neck: Normal range of motion. Neck supple.  Cardiovascular: Normal rate and regular rhythm.   Pulmonary/Chest: Effort normal and breath sounds normal.  Abdominal: Soft. There is tenderness in the right lower quadrant and periumbilical area. There is rebound. There is no rigidity, no guarding, no CVA tenderness and negative Murphy's sign.    No signs of abdominal distention  Musculoskeletal: Normal range of motion.  No LE swelling  Neurological: He is alert and oriented to person, place, and time.  Acting at baseline  Skin: Skin is warm and dry. No rash noted.  Nursing note and vitals reviewed.   ED Treatments / Results  Labs (all labs ordered are listed, but only abnormal results are  displayed) Labs Reviewed  COMPREHENSIVE METABOLIC PANEL - Abnormal; Notable for the following:       Result Value   Potassium 2.9 (*)    Glucose, Bld 136 (*)    All other components within normal limits  URINALYSIS, ROUTINE W REFLEX MICROSCOPIC - Abnormal; Notable for the following:    APPearance CLOUDY (*)    All other components within normal limits  LIPASE, BLOOD - Abnormal; Notable for the following:    Lipase 59 (*)    All other components within normal limits  CBC    EKG  EKG Interpretation None       Radiology No results found.  Procedures Procedures (including critical care time)  Medications Ordered in ED Medications  sodium chloride 0.9 % bolus 1,000 mL (not administered)  ondansetron (ZOFRAN) injection 4 mg (not administered)     Initial Impression / Assessment and Plan / ED Course  I have reviewed the triage vital signs and the nursing notes.  Pertinent labs & imaging results that were available during my care of the patient were reviewed by me and considered in my medical decision making (see chart for details).  Clinical Course     No hematuria or infection in urine. Pt has positive rebound tenderness to the RLQ, will obtain CT abd/pelv. At end of shift patient sign out to PeruBrowning, PA-C to follow-up on CT abd/pelv - pt will need potassium replaced either PO or IV based on CT results.  Final Clinical Impressions(s) / ED Diagnoses   Final diagnoses:  Hypokalemia    New Prescriptions New Prescriptions   No medications on file     Marlon Peliffany Marion Seese, PA-C 12/22/15 0539    Layla MawKristen N Ward, DO 12/22/15 0710

## 2015-12-22 NOTE — ED Notes (Signed)
Patient transported to CT 

## 2015-12-22 NOTE — ED Triage Notes (Signed)
Pt reports to the ED for eval of right sided flank pain x 1 week. Pain is intermittent in nature but the pain has been getting progressively worse. Pt denies any N/V/D at this time. Pt reports some possible increased urinary frequency but denies any hematuria, urgency, or dysuria.

## 2015-12-22 NOTE — ED Provider Notes (Signed)
Patient signed out to me at shift change.  Patient with lower abdominal pain.  CT to rule out appy.   CT shows bladder wall thickening.  Patient denies dysuria, but states that he has noticed urinary frequency which he associates with the abdominal pain.  Will send urine for culture.   Treat for cystitis with keflex.  PCP follow-up.     Roxy Horsemanobert Galaxy Borden, PA-C 12/22/15 0755    Layla MawKristen N Ward, DO 12/22/15 2315

## 2015-12-23 LAB — URINE CULTURE: CULTURE: NO GROWTH

## 2017-04-06 IMAGING — CT CT ABD-PELV W/ CM
2 of 4 series · 15 of 46 positions shown, 17 images · IV contrast (iopamidol)
Comparison: Prior CT from 12/21/2007.

CLINICAL DATA: Initial evaluation for acute right lower quadrant
abdominal pain.

EXAM:
CT ABDOMEN AND PELVIS WITH CONTRAST
TECHNIQUE: Multidetector CT imaging of the abdomen and pelvis was performed
using the standard protocol following bolus administration of
intravenous contrast.
CONTRAST:  100mL [id number] IOPAMIDOL ([id number]) INJECTION 61%

[Series 2: a/p w/ 5mm · axial · 0.63mm/px · z∈[+699,+1089]mm · 12 of 90 slices shown, 14 images]
[im 8/90  soft-tissue]
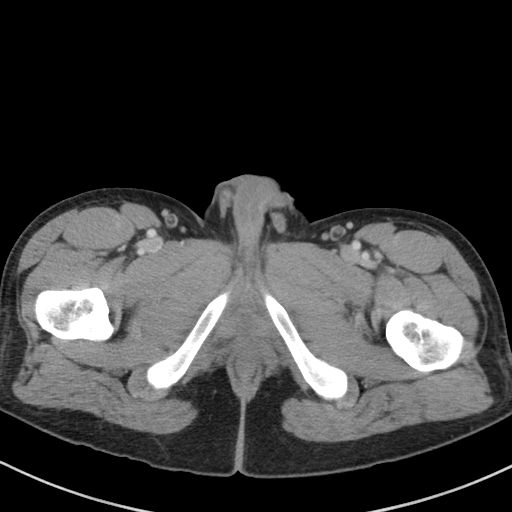
[im 8/90  bone]
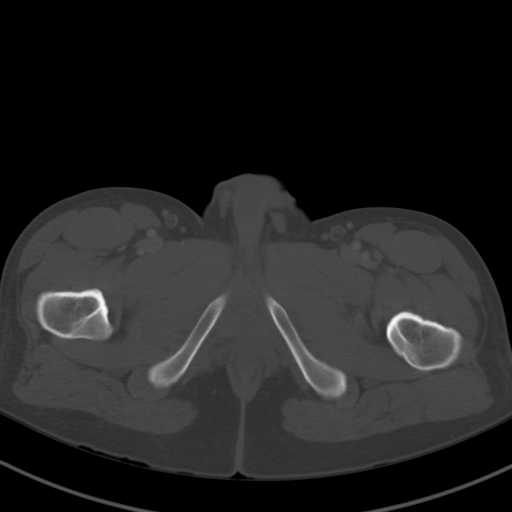
[im 15/90  soft-tissue]
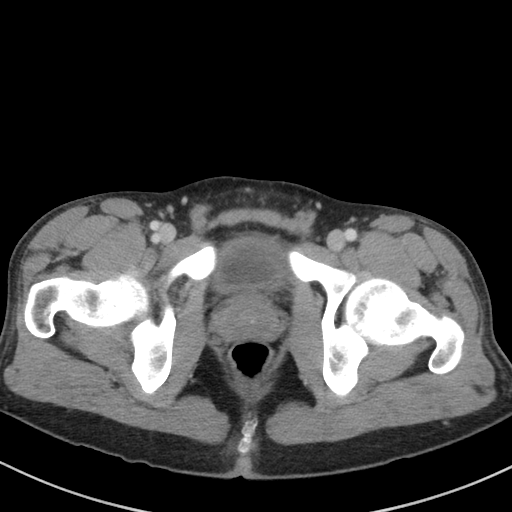
[im 22/90  soft-tissue]
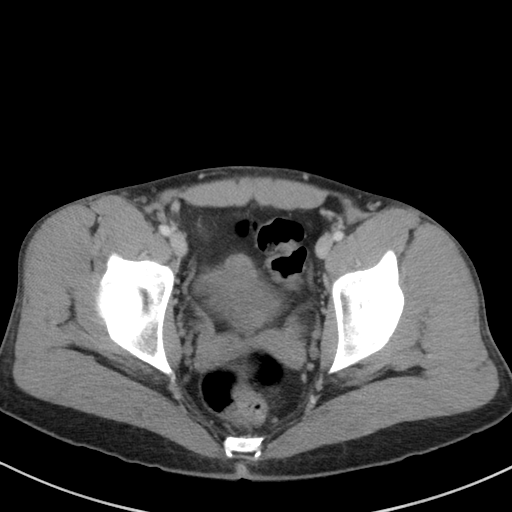
[im 29/90  soft-tissue]
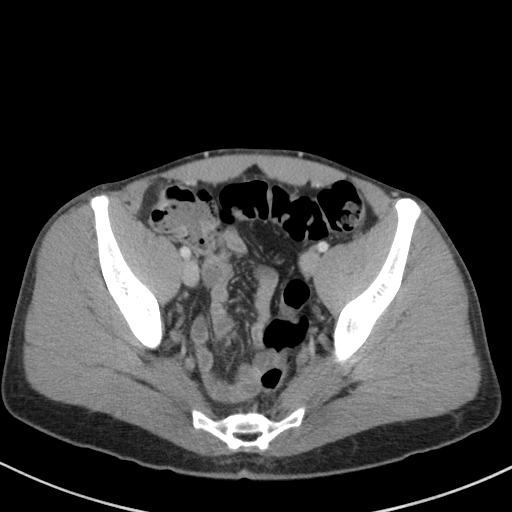
[im 36/90  soft-tissue]
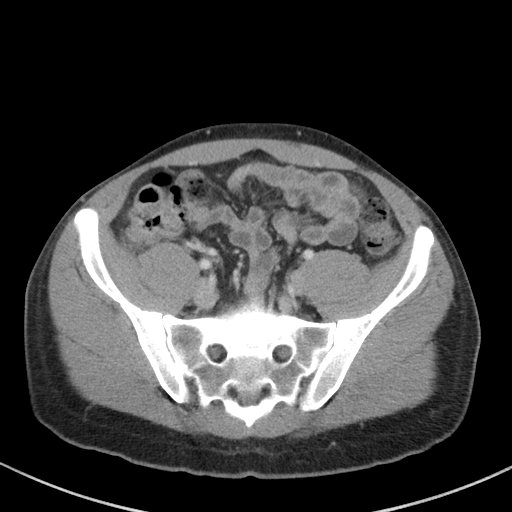
[im 43/90  soft-tissue]
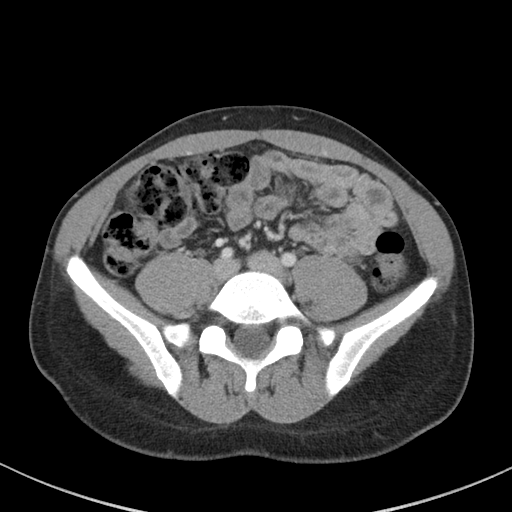
[im 50/90  soft-tissue]
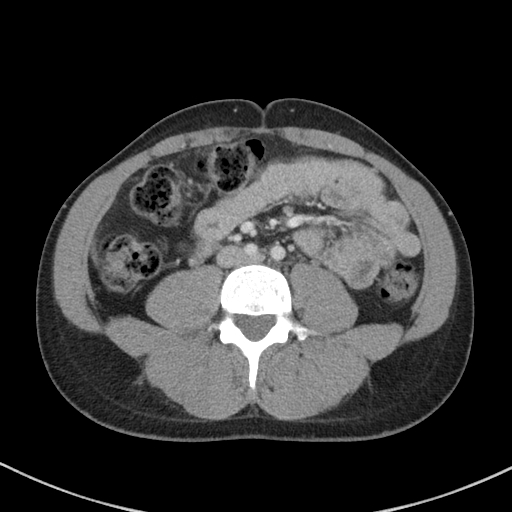
[im 57/90  soft-tissue]
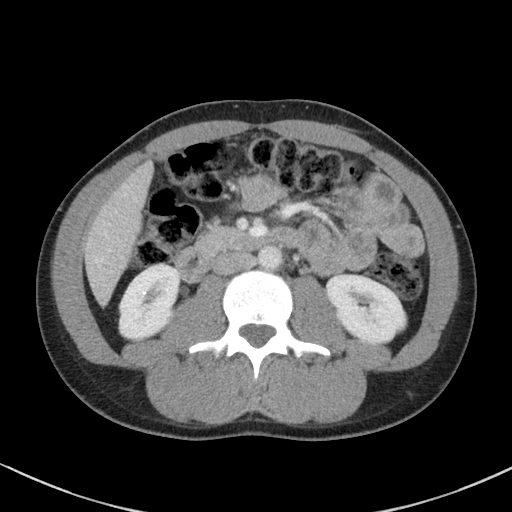
[im 65/90  soft-tissue]
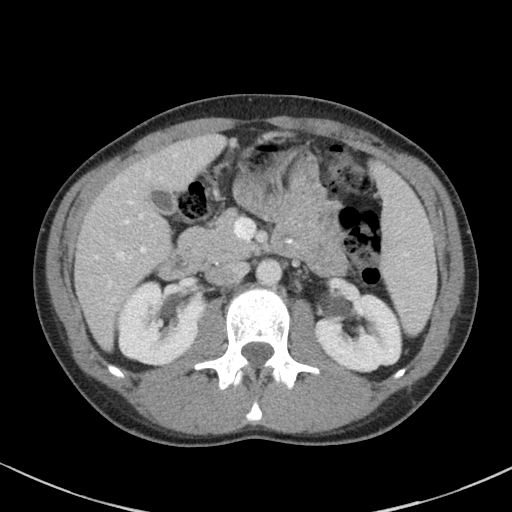
[im 65/90  bone]
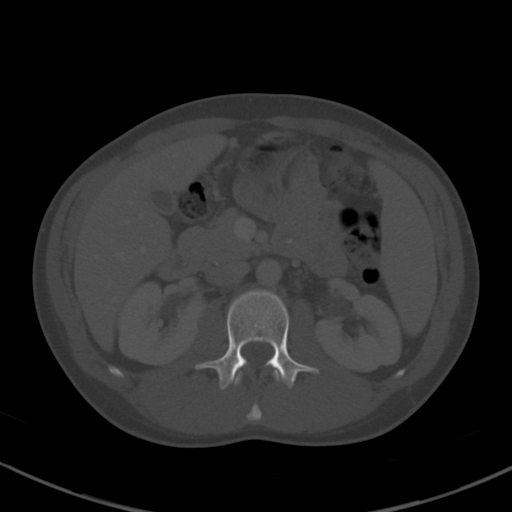
[im 72/90  soft-tissue]
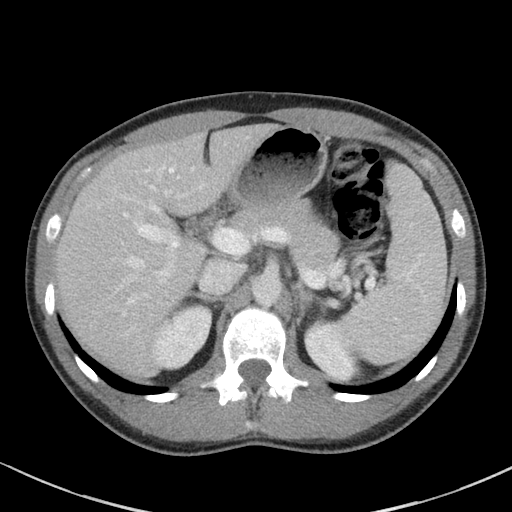
[im 79/90  soft-tissue]
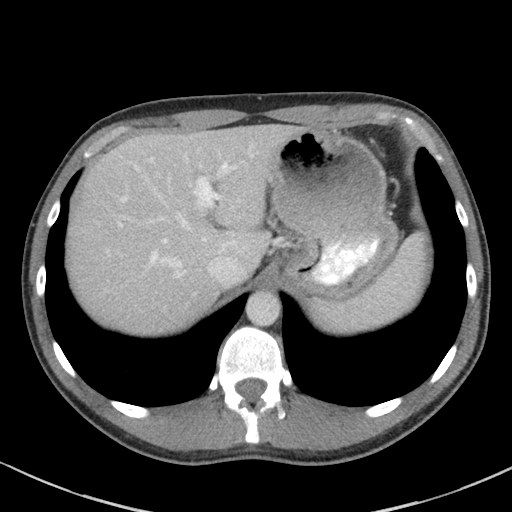
[im 86/90  soft-tissue]
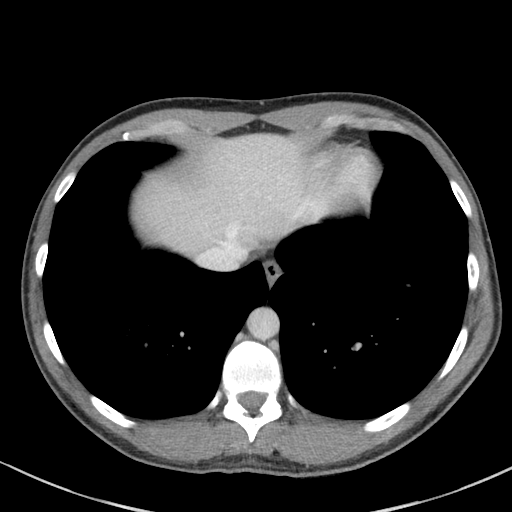

[Series 5: a/p w/ cor · coronal · 0.61mm/px · 3 of 120 slices shown]
[im 40/120  soft-tissue]
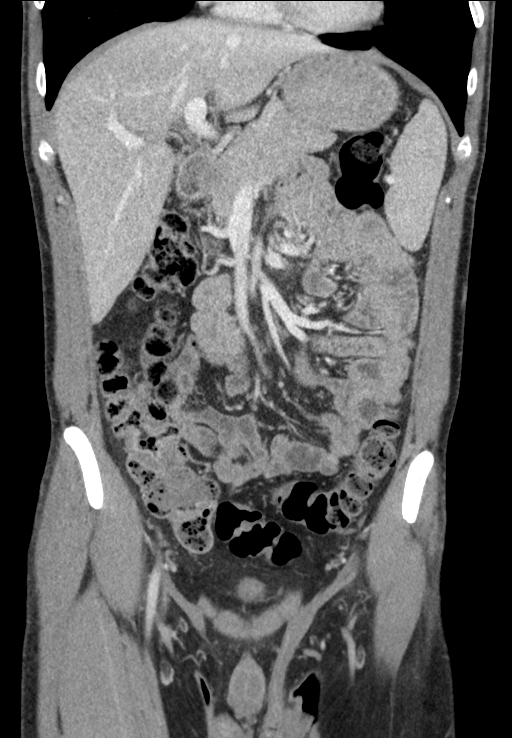
[im 53/120  soft-tissue]
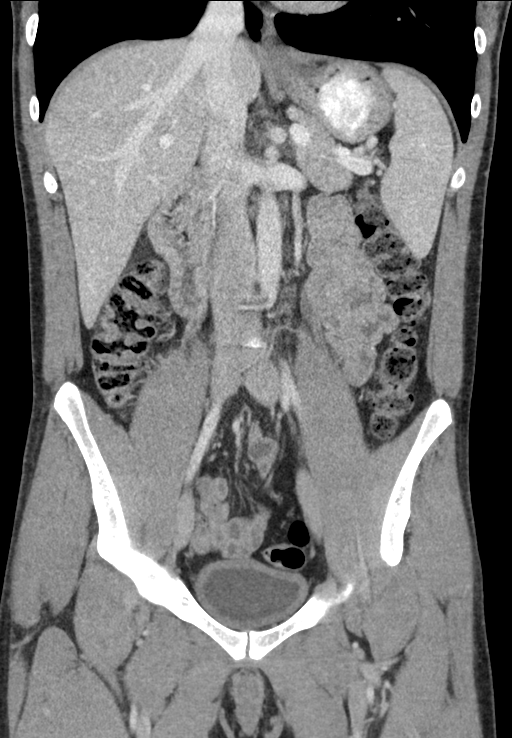
[im 67/120  soft-tissue]
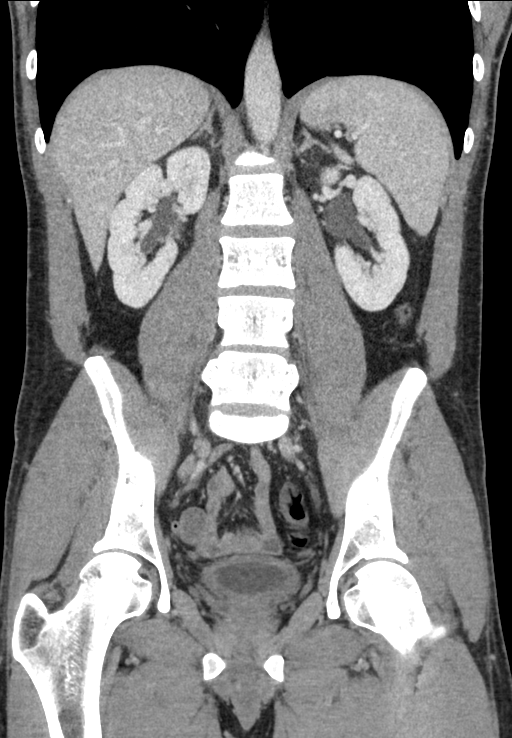

[15 of 46 positions shown; findings below may reference images not displayed]

FINDINGS: Lower chest: Visualized lung bases are clear.

Hepatobiliary: Liver demonstrates a normal contrast enhanced
appearance. Gallbladder within normal limits. No biliary dilatation.

Pancreas: Pancreas within normal limits.

Spleen: Spleen within normal limits.

Adrenals/Urinary Tract: Adrenal glands are normal. Kidneys equal in
size with symmetric enhancement. 3 mm nonobstructive stone present
within the lower pole right kidney. No obstructive calculi seen
along the course of either renal collecting system. No
hydronephrosis or hydroureter. No focal enhancing renal mass.

Mild circumferential bladder wall thickening, which may be related
to incomplete distension or possibly acute cystitis.

Stomach/Bowel: Stomach within normal limits. No evidence for bowel
obstruction. Appendix well visualized within the right lower
quadrant and is of normal caliber and appearance without associated
inflammatory changes to suggest acute appendicitis. No abnormal wall
thickening, mucosal enhancement, or inflammatory fat stranding seen
elsewhere about the bowels. Moderate amount retained stool within
the colon, suggesting constipation.

Vascular/Lymphatic: Normal intravascular enhancement seen throughout
the intra- abdominal aorta and its branch vessels. No adenopathy.

Reproductive: Prostate normal.

Other: No free air or fluid.

Musculoskeletal: No acute osseous abnormality. No worrisome lytic or
blastic osseous lesions.
IMPRESSION: 1. Circumferential bladder wall thickening, which may be related to
incomplete distension or possibly acute cystitis. Correlation with
urinalysis recommended.
2. No other acute intra- abdominal or pelvic process identified.
3. Normal appendix.
4. 3 mm nonobstructive right renal calculus.
5. Moderate amount of retained stool within the colon, suggesting
constipation.

## 2018-01-01 ENCOUNTER — Ambulatory Visit (HOSPITAL_COMMUNITY)
Admission: EM | Admit: 2018-01-01 | Discharge: 2018-01-01 | Disposition: A | Discharge status: Home / Self Care | Payer: BLUE CROSS/BLUE SHIELD | Attending: Family Medicine | Admitting: Family Medicine

## 2018-01-01 ENCOUNTER — Encounter (HOSPITAL_COMMUNITY): Payer: Self-pay

## 2018-01-01 DIAGNOSIS — B029 Zoster without complications: Secondary | ICD-10-CM | POA: Insufficient documentation

## 2018-01-01 MED ORDER — HYDROCODONE-ACETAMINOPHEN 5-325 MG PO TABS
1.0000 | ORAL_TABLET | Freq: Four times a day (QID) | ORAL | 0 refills | Status: DC | PRN
Start: 2018-01-01 — End: 2019-09-03

## 2018-01-01 MED ORDER — VALACYCLOVIR HCL 1 G PO TABS: 1000.0000 mg | ORAL_TABLET | Freq: Three times a day (TID) | ORAL | 0 refills | Status: AC

## 2018-01-01 NOTE — ED Provider Notes (Signed)
[facility]    CSN: [id number] Arrival date & time: 01/01/18  1633     History   Chief Complaint Chief Complaint  Patient presents with  . Herpes Zoster    HPI George Mason is a 33 y.o. male.   Patient symptoms started with left sided neck pain about 2 days ago.  Today he broke out in a rash.  The rash extends from midline in chest to midline back.  HPI  Past Medical History:  Diagnosis Date  . Asthma   . Hemorrhoid     There are no active problems to display for this patient.   Past Surgical History:  Procedure Laterality Date  . eardrum surgery    . INNER EAR SURGERY         Home Medications    Prior to Admission medications   Medication Sig Start Date End Date Taking? Authorizing Provider  acetaminophen (TYLENOL) 325 MG tablet Take 650 mg by mouth every 6 (six) hours as needed for mild pain.     [provider]  cephALEXin (KEFLEX) 500 MG capsule Take 1 capsule (500 mg total) by mouth 2 (two) times daily. 12/22/15   Roxy Horseman, PA-C  HYDROcodone-acetaminophen (NORCO/VICODIN) 5-325 MG tablet Take 1 tablet by mouth every 6 (six) hours as needed. 12/22/15   Roxy Horseman, PA-C    Family History Family History  Problem Relation Age of Onset  . Hypertension Other   . Diabetes Other   . Cancer Other   . Healthy Mother   . Healthy Father     Social History Social History   Tobacco Use  . Smoking status: Current Every Day Smoker    Packs/day: 0.50  . Smokeless tobacco: Never Used  Substance Use Topics  . Alcohol use: Yes    Comment: daily   . Drug use: No     Allergies   Penicillins   Review of Systems Review of Systems  Musculoskeletal: Positive for neck pain.  Skin: Positive for rash.     Physical Exam Triage Vital Signs ED Triage Vitals  Enc Vitals Group     BP 01/01/18 1743 (!) 143/87     Pulse Rate 01/01/18 1743 78     Resp 01/01/18 1743 18     Temp 01/01/18 1743 98.7 F (37.1 C)     Temp  src --      SpO2 01/01/18 1743 100 %     Weight --      Height --      Head Circumference --      Peak Flow --      Pain Score 01/01/18 1742 7     Pain Loc --      Pain Edu? --      Excl. in GC? --    No data found.  Updated Vital Signs BP (!) 143/87   Pulse 78   Temp 98.7 F (37.1 C)   Resp 18   SpO2 100%   Visual Acuity Right Eye Distance:   Left Eye Distance:   Bilateral Distance:    Right Eye Near:   Left Eye Near:    Bilateral Near:     Physical Exam Constitutional:      Appearance: Normal appearance. He is normal weight.  HENT:     Head: Normocephalic.  Skin:    Comments: Rash left upper chest neck and back consistent with zoster  Neurological:     Mental Status: He is alert.  UC Treatments / Results  Labs (all labs ordered are listed, but only abnormal results are displayed) Labs Reviewed - No data to display  EKG None  Radiology No results found.  Procedures Procedures (including critical care time)  Medications Ordered in UC Medications - No data to display  Initial Impression / Assessment and Plan / UC Course  I have reviewed the triage vital signs and the nursing notes.  Pertinent labs & imaging results that were available during my care of the patient were reviewed by me and considered in my medical decision making (see chart for details).     Herpes zoster.  Will treat with valacyclovir for 1 week.  Also patient request medicine for pain Final Clinical Impressions(s) / UC Diagnoses   Final diagnoses:  None   Discharge Instructions   None    ED Prescriptions    None     Controlled Substance Prescriptions [place] Controlled Substance Registry consulted? Yes, I have consulted the [place] Controlled Substances Registry for this patient, and feel the risk/benefit ratio today is favorable for proceeding with this prescription for a controlled substance.   Frederica Kuster, MD 01/01/18 [phone]

## 2018-01-01 NOTE — ED Triage Notes (Signed)
Pt presents with complaints of pain to left shoulder and neck x 2 days ago followed by red raised rash yesterday.

## 2018-04-03 ENCOUNTER — Ambulatory Visit (HOSPITAL_COMMUNITY)
Admission: EM | Admit: 2018-04-03 | Discharge: 2018-04-03 | Disposition: A | Discharge status: Home / Self Care | Payer: BLUE CROSS/BLUE SHIELD | Attending: Family Medicine | Admitting: Family Medicine

## 2018-04-03 ENCOUNTER — Other Ambulatory Visit: Payer: Self-pay

## 2018-04-03 ENCOUNTER — Encounter (HOSPITAL_COMMUNITY): Payer: Self-pay

## 2018-04-03 DIAGNOSIS — R0782 Intercostal pain: Secondary | ICD-10-CM | POA: Diagnosis not present

## 2018-04-03 NOTE — ED Triage Notes (Addendum)
Patient presents to Urgent Care with complaints of intermittent centralized CP since 10 days ago. Patient states he has no cardiac hx, pain feels sharp and is in the middle of his chest, nothing specific makes the pain better or worse.  Pt admits to increased stress recently, appears anxious during triage.

## 2018-04-03 NOTE — ED Notes (Signed)
Patient verbalizes understanding of discharge instructions. Opportunity for questioning and answers were provided. Patient discharged from [facility] by MD. 

## 2018-04-16 NOTE — ED Provider Notes (Signed)
[facility]   [id number] 04/03/18 Arrival Time: 1333  ASSESSMENT & PLAN:  1. Intercostal pain    Patient history and exam consistent with non-cardiac cause of chest pain. Conservative measures indicated. OTC analgesics as needed. Worsening signs and symptoms discussed and patient verbalized understanding.  No indication for chest imaging at this time. Discussed. Chest pain precautions given. Reviewed expectations re: course of current medical issues. Questions answered. Outlined signs and symptoms indicating need for more acute intervention. Patient verbalized understanding. After Visit Summary given.   SUBJECTIVE: History from: patient. George Mason is a 33 y.o. male who presents with complaint of fairly persistent R midline anterior chest discomfort described as dull and that does not radiate. Onset gradual, over the past 7-10 days with stable course since that time. No specific worsening. 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 Also reports increased anxiety lately with COVID-19 pandemic. Described symptoms without associated n/v; without associated SOB. Typical duration of symptoms when present: "always feel it a little but sometimes I feel a sharp pain for a second or two"; resolves quickly. Other associated symptoms: none reported. Denies: exertional chest pressure/discomfort, irregular heart beat, lower extremity edema, near-syncope, orthopnea, palpitations and paroxysmal nocturnal dyspnea. Aggravating factors: include certain movements of torso. Alleviating factors: have not been identified. Does not wake him at night. Recent illnesses: none. Fever: absent. Ambulatory without assistance. Self/OTC treatment: none reported. History of similar: no. Illicit drug use: none. Reports that he smokes cigarettes. He has been smoking about 0.25 packs per day. He has never used smokeless tobacco.  ROS: As per HPI. All other systems  negative.   OBJECTIVE:  Vitals:   04/03/18 1350  BP: 118/87  Pulse: 97  Resp: 19  Temp: 97.9 F (36.6 C)  TempSrc: Oral  SpO2: 98%    General appearance: alert, oriented, no acute distress Eyes: PERRLA; EOMI; conjunctivae normal HENT: normocephalic; atraumatic Neck: supple with FROM Lungs: without labored respirations; CTAB Heart: regular rate and rhythm without murmer Chest Wall: with mild tenderness to palpation over R sternal area Abdomen: soft, non-tender; bowel sounds normal; no masses or organomegaly; no guarding or rebound tenderness Extremities: without edema; without calf swelling or tenderness; symmetrical without gross deformities Skin: warm and dry; without rash or lesions Psychological: alert and cooperative; normal mood and affect   Allergies  Allergen Reactions  . Penicillins Hives    Past Medical History:  Diagnosis Date  . Asthma   . Hemorrhoid    Social History   Socioeconomic History  . Marital status: Single    Spouse name: Not on file  . Number of children: Not on file  . Years of education: Not on file  . Highest education level: Not on file  Occupational History  . Not on file  Social Needs  . Financial resource strain: Not on file  . Food insecurity:    Worry: Not on file    Inability: Not on file  . Transportation needs:    Medical: Not on file    Non-medical: Not on file  Tobacco Use  . Smoking status: Current Every Day Smoker    Packs/day: 0.25  . Smokeless tobacco: Never Used  Substance and Sexual Activity  . Alcohol use: Yes    Comment: daily   . Drug use: No  . Sexual activity: Not on file  Lifestyle  . Physical activity:    Days per week: Not on file    Minutes per session: Not on file  .  Stress: Not on file  Relationships  . Social connections:    Talks on phone: Not on file    Gets together: Not on file    Attends religious service: Not on file    Active member of club or organization: Not on file    Attends  meetings of clubs or organizations: Not on file    Relationship status: Not on file  . Intimate partner violence:    Fear of current or ex partner: Not on file    Emotionally abused: Not on file    Physically abused: Not on file    Forced sexual activity: Not on file  Other Topics Concern  . Not on file  Social History Narrative  . Not on file   Family History  Problem Relation Age of Onset  . Hypertension Other   . Diabetes Other   . Cancer Other   . Healthy Mother   . Healthy Father    Past Surgical History:  Procedure Laterality Date  . eardrum surgery    . INNER EAR SURGERY       Mardella Layman, MD 04/16/18 1020

## 2019-09-03 ENCOUNTER — Ambulatory Visit (HOSPITAL_COMMUNITY)
Admission: EM | Admit: 2019-09-03 | Discharge: 2019-09-03 | Disposition: A | Discharge status: Home / Self Care | Payer: BC Managed Care – PPO | Attending: Family Medicine | Admitting: Family Medicine

## 2019-09-03 DIAGNOSIS — Z20822 Contact with and (suspected) exposure to covid-19: Secondary | ICD-10-CM

## 2019-09-03 DIAGNOSIS — B349 Viral infection, unspecified: Secondary | ICD-10-CM

## 2019-09-03 MED ORDER — ACETAMINOPHEN 325 MG PO TABS: 650.0000 mg | ORAL_TABLET | Freq: Four times a day (QID) | ORAL | 0 refills | Status: AC | PRN

## 2019-09-03 MED ORDER — ONDANSETRON HCL 4 MG PO TABS
4.0000 mg | ORAL_TABLET | Freq: Three times a day (TID) | ORAL | 0 refills | Status: AC | PRN
Start: 2019-09-03 — End: ?

## 2019-09-03 MED ORDER — FAMOTIDINE 20 MG PO TABS: 20.0000 mg | ORAL_TABLET | Freq: Two times a day (BID) | ORAL | 0 refills | Status: AC

## 2019-09-03 MED ORDER — BENZONATATE 100 MG PO CAPS: 100.0000 mg | ORAL_CAPSULE | Freq: Three times a day (TID) | ORAL | 0 refills | Status: AC

## 2019-09-03 NOTE — Discharge Instructions (Signed)
Take medications as prescribed  If severe symptoms develop, shortness of breath, high fevers unable to tolerate liquids, return or go to the emergency department  If your Covid-19 test is positive, you will receive a phone call from [facility] regarding your results. Negative test results are not called. Both positive and negative results area always visible on MyChart. If you do not have a MyChart account, sign up instructions are in your discharge papers.   Persons who are directed to care for themselves at home may discontinue isolation under the following conditions:   At least 10 days have passed since symptom onset and  At least 24 hours have passed without running a fever (this means without the use of fever-reducing medications) and  Other symptoms have improved.  Persons infected with COVID-19 who never develop symptoms may discontinue isolation and other precautions 10 days after the date of their first positive COVID-19 test.

## 2019-09-03 NOTE — ED Provider Notes (Signed)
[facility]    CSN: [id number] Arrival date & time: 09/03/19  1734      History   Chief Complaint Chief Complaint  Patient presents with  . Covid Exposure  . Nasal Congestion  . Headache    HPI George Mason is a 34 y.o. male.   Patient reports for valuation of 3-day history of headache, fatigue, acid reflux symptoms, mild cough and nasal congestion.  Symptoms started after recent Covid exposure.  Reports headache is frontal.  States it is a mild headache.  Reports generalized fatigue and occasional body ache.  No fevers.  Feeling of mild nausea and the sensation he might regurgitate but has not vomited.  This is what he attributes this reflux symptoms.  Mild upper abdominal discomfort.  No diarrhea.  Reports mild cough that is dry.  No shortness of breath.  Nasal congestion.  No sore throat.  No change in taste or smell.  Has been eating and drinking.  Did not receive Covid vaccines.     Past Medical History:  Diagnosis Date  . Asthma   . Hemorrhoid     There are no problems to display for this patient.   Past Surgical History:  Procedure Laterality Date  . eardrum surgery    . INNER EAR SURGERY         Home Medications    Prior to Admission medications   Medication Sig Start Date End Date Taking? Authorizing Provider  acetaminophen (TYLENOL) 325 MG tablet Take 2 tablets (650 mg total) by mouth every 6 (six) hours as needed. 09/03/19   Quanta Robertshaw, Veryl Speak, PA-C  benzonatate (TESSALON) 100 MG capsule Take 1-2 capsules (100-200 mg total) by mouth 3 (three) times daily for 7 days. 09/03/19 09/10/19  Kroy Sprung, Veryl Speak, PA-C  cephALEXin (KEFLEX) 500 MG capsule Take 1 capsule (500 mg total) by mouth 2 (two) times daily. 12/22/15   Roxy Horseman, PA-C  famotidine (PEPCID) 20 MG tablet Take 1 tablet (20 mg total) by mouth 2 (two) times daily for 7 days. 09/03/19 09/10/19  Anea Fodera, Veryl Speak, PA-C  ondansetron (ZOFRAN) 4 MG tablet Take 1 tablet (4 mg total) by mouth every 8  (eight) hours as needed for nausea or vomiting. 09/03/19   Ramanda Paules, Veryl Speak, PA-C  valACYclovir (VALTREX) 1000 MG tablet Take 1 tablet (1,000 mg total) by mouth 3 (three) times daily. 01/01/18   Frederica Kuster, MD    Family History Family History  Problem Relation Age of Onset  . Hypertension Other   . Diabetes Other   . Cancer Other   . Healthy Mother   . Healthy Father     Social History Social History   Tobacco Use  . Smoking status: Current Every Day Smoker    Packs/day: 0.25  . Smokeless tobacco: Never Used  Vaping Use  . Vaping Use: Never used  Substance Use Topics  . Alcohol use: Yes    Comment: daily   . Drug use: No     Allergies   Penicillins   Review of Systems Review of Systems   Physical Exam Triage Vital Signs ED Triage Vitals [09/03/19 1927]  Enc Vitals Group     BP      Pulse      Resp      Temp      Temp src      SpO2      Weight      Height      Head Circumference  Peak Flow      Pain Score 5     Pain Loc      Pain Edu?      Excl. in GC?    No data found.  Updated Vital Signs BP (!) 157/87 (BP Location: Left Arm)   Pulse 88   Temp 98.2 F (36.8 C) (Oral)   Resp 16   SpO2 99%   Visual Acuity Right Eye Distance:   Left Eye Distance:   Bilateral Distance:    Right Eye Near:   Left Eye Near:    Bilateral Near:     Physical Exam Vitals and nursing note reviewed.  Constitutional:      General: He is not in acute distress.    Appearance: He is well-developed. He is not ill-appearing.  HENT:     Head: Normocephalic and atraumatic.     Right Ear: Tympanic membrane normal.     Left Ear: Tympanic membrane normal.     Nose: Congestion and rhinorrhea present.     Mouth/Throat:     Mouth: Mucous membranes are moist.  Eyes:     Conjunctiva/sclera: Conjunctivae normal.  Cardiovascular:     Rate and Rhythm: Normal rate and regular rhythm.     Heart sounds: No murmur heard.   Pulmonary:     Effort: Pulmonary effort is  normal. No respiratory distress.     Breath sounds: Normal breath sounds. No wheezing, rhonchi or rales.  Abdominal:     Palpations: Abdomen is soft.     Tenderness: There is no abdominal tenderness.  Musculoskeletal:     Cervical back: Neck supple.  Lymphadenopathy:     Cervical: No cervical adenopathy.  Skin:    General: Skin is warm and dry.  Neurological:     Mental Status: He is alert.      UC Treatments / Results  Labs (all labs ordered are listed, but only abnormal results are displayed) Labs Reviewed  SARS CORONAVIRUS 2 (TAT 6-24 HRS)    EKG   Radiology No results found.  Procedures Procedures (including critical care time)  Medications Ordered in UC Medications - No data to display  Initial Impression / Assessment and Plan / UC Course  I have reviewed the triage vital signs and the nursing notes.  Pertinent labs & imaging results that were available during my care of the patient were reviewed by me and considered in my medical decision making (see chart for details).     #Viral illness Patient is 34 year old presenting with viral illness.  Normal vital signs.  Benign exam.  We will treat symptomatically.  Covid sent.  Return, follow-up and emergency department precautions discussed.  Patient verbalized understanding plan of care Final Clinical Impressions(s) / UC Diagnoses   Final diagnoses:  Viral illness  Exposure to COVID-19 virus     Discharge Instructions     Take medications as prescribed  If severe symptoms develop, shortness of breath, high fevers unable to tolerate liquids, return or go to the emergency department  If your Covid-19 test is positive, you will receive a phone call from [facility] regarding your results. Negative test results are not called. Both positive and negative results area always visible on MyChart. If you do not have a MyChart account, sign up instructions are in your discharge papers.   Persons who are directed  to care for themselves at home may discontinue isolation under the following conditions:  . At least 10 days have passed since symptom onset  and . At least 24 hours have passed without running a fever (this means without the use of fever-reducing medications) and . Other symptoms have improved.  Persons infected with COVID-19 who never develop symptoms may discontinue isolation and other precautions 10 days after the date of their first positive COVID-19 test.       ED Prescriptions    Medication Sig Dispense Auth. Provider   ondansetron (ZOFRAN) 4 MG tablet Take 1 tablet (4 mg total) by mouth every 8 (eight) hours as needed for nausea or vomiting. 6 tablet Jailyn Leeson, Veryl Speak, PA-C   benzonatate (TESSALON) 100 MG capsule Take 1-2 capsules (100-200 mg total) by mouth 3 (three) times daily for 7 days. 42 capsule Tyller Bowlby, Veryl Speak, PA-C   acetaminophen (TYLENOL) 325 MG tablet Take 2 tablets (650 mg total) by mouth every 6 (six) hours as needed. 30 tablet Thuy Atilano, Veryl Speak, PA-C   famotidine (PEPCID) 20 MG tablet Take 1 tablet (20 mg total) by mouth 2 (two) times daily for 7 days. 14 tablet Norva Bowe, Veryl Speak, PA-C     PDMP not reviewed this encounter.   Hermelinda Medicus, PA-C 09/03/19 2308

## 2019-09-03 NOTE — ED Triage Notes (Addendum)
Headache, fatigue, acid reflux x 3 days. Reports mild cough and nasal congestion.   COVID exposure about a week ago.   Has not had COVID vaccine.

## 2019-09-04 LAB — SARS CORONAVIRUS 2 (TAT 6-24 HRS): SARS Coronavirus 2: NEGATIVE

## 2019-10-15 ENCOUNTER — Other Ambulatory Visit: Payer: BC Managed Care – PPO

## 2019-10-15 DIAGNOSIS — Z20822 Contact with and (suspected) exposure to covid-19: Secondary | ICD-10-CM

## 2019-10-16 LAB — SARS-COV-2, NAA 2 DAY TAT

## 2019-10-16 LAB — NOVEL CORONAVIRUS, NAA: SARS-CoV-2, NAA: NOT DETECTED

## 2019-11-02 ENCOUNTER — Encounter (HOSPITAL_COMMUNITY): Payer: Self-pay

## 2019-11-02 ENCOUNTER — Ambulatory Visit (HOSPITAL_COMMUNITY)
Admission: EM | Admit: 2019-11-02 | Discharge: 2019-11-02 | Disposition: A | Discharge status: Home / Self Care | Payer: BC Managed Care – PPO | Attending: Urgent Care | Admitting: Urgent Care

## 2019-11-02 DIAGNOSIS — H9201 Otalgia, right ear: Secondary | ICD-10-CM | POA: Diagnosis present

## 2019-11-02 DIAGNOSIS — R059 Cough, unspecified: Secondary | ICD-10-CM

## 2019-11-02 DIAGNOSIS — J3489 Other specified disorders of nose and nasal sinuses: Secondary | ICD-10-CM

## 2019-11-02 DIAGNOSIS — J4530 Mild persistent asthma, uncomplicated: Secondary | ICD-10-CM | POA: Insufficient documentation

## 2019-11-02 DIAGNOSIS — J019 Acute sinusitis, unspecified: Secondary | ICD-10-CM

## 2019-11-02 LAB — POCT RAPID STREP A, ED / UC: Streptococcus, Group A Screen (Direct): NEGATIVE

## 2019-11-02 MED ORDER — DOXYCYCLINE HYCLATE 100 MG PO CAPS: 100.0000 mg | ORAL_CAPSULE | Freq: Two times a day (BID) | ORAL | 0 refills | Status: AC

## 2019-11-02 MED ORDER — PREDNISONE 20 MG PO TABS: ORAL_TABLET | ORAL | 0 refills | Status: AC

## 2019-11-02 MED ORDER — ALBUTEROL SULFATE HFA 108 (90 BASE) MCG/ACT IN AERS: 1.0000 | INHALATION_SPRAY | Freq: Four times a day (QID) | RESPIRATORY_TRACT | 0 refills | Status: AC | PRN

## 2019-11-02 NOTE — ED Triage Notes (Signed)
Pt c/o cough for approx 1 month, that has become productive in the past week with clear secretions, congestion, pressure to right ear that increases when lying down on right side. Sore throat for past week.  States he feels some chest "congestion".   Pt states he had a negative COVID test 10/14. States he was evaluated at [facility] for symptoms and was given decadron, z-pack, decongestant.  Denies fever, n/v/d.  No OTC meds for symptoms.   Bilateral breath sounds CTA with slightly diminished exchange in right base.  Oropharynx with erythema.

## 2019-11-02 NOTE — ED Provider Notes (Signed)
[name]   MRN: [id number] DOB: 1985/10/30  Subjective:   George Mason is a 34 y.o. male presenting for 1 month history of persistent sinus pressure, worse over the right side, right ear pain and fullness, cough, chest congestion.  Patient has undergone more than 5 Covid testing October and all were negative.  Last 1 was 1014.  He has been to ENT, states that he was given Decadron, azithromycin and a decongestion.  States that he has continued to work throughout all of this.  Has a history of asthma and is almost out of his albuterol inhaler.  No current facility-administered medications for this encounter.  Current Outpatient Medications:    acetaminophen (TYLENOL) 325 MG tablet, Take 2 tablets (650 mg total) by mouth every 6 (six) hours as needed., Disp: 30 tablet, Rfl: 0   famotidine (PEPCID) 20 MG tablet, Take 1 tablet (20 mg total) by mouth 2 (two) times daily for 7 days., Disp: 14 tablet, Rfl: 0   ondansetron (ZOFRAN) 4 MG tablet, Take 1 tablet (4 mg total) by mouth every 8 (eight) hours as needed for nausea or vomiting., Disp: 6 tablet, Rfl: 0   valACYclovir (VALTREX) 1000 MG tablet, Take 1 tablet (1,000 mg total) by mouth 3 (three) times daily., Disp: 21 tablet, Rfl: 0   Allergies  Allergen Reactions   Penicillins Hives    Past Medical History:  Diagnosis Date   Asthma    Hemorrhoid      Past Surgical History:  Procedure Laterality Date   eardrum surgery     INNER EAR SURGERY      Family History  Problem Relation Age of Onset   Hypertension Other    Diabetes Other    Cancer Other    Healthy Mother    Healthy Father     Social History   Tobacco Use   Smoking status: Current Every Day Smoker    Packs/day: 0.25   Smokeless tobacco: Never Used  Vaping Use   Vaping Use: Never used  Substance Use Topics   Alcohol use: Yes    Comment: daily    Drug use: No    ROS   Objective:   Vitals: BP (!) 139/100 (BP  Location: Right Arm)    Pulse 65    Temp 98.3 F (36.8 C) (Oral)    Resp 17    SpO2 100%   Physical Exam Constitutional:      General: He is not in acute distress.    Appearance: Normal appearance. He is well-developed and normal weight. He is not ill-appearing, toxic-appearing or diaphoretic.  HENT:     Head: Normocephalic and atraumatic.     Right Ear: Tympanic membrane, ear canal and external ear normal. There is no impacted cerumen.     Left Ear: Tympanic membrane, ear canal and external ear normal. There is no impacted cerumen.     Nose: Congestion and rhinorrhea present.     Mouth/Throat:     Mouth: Mucous membranes are moist.     Pharynx: Posterior oropharyngeal erythema present. No oropharyngeal exudate.     Comments: Significant postnasal drainage overlying pharynx. Eyes:     General: No scleral icterus.       Right eye: No discharge.        Left eye: No discharge.     Extraocular Movements: Extraocular movements intact.     Conjunctiva/sclera: Conjunctivae normal.     Pupils: Pupils are equal, round, and reactive to  light.  Cardiovascular:     Rate and Rhythm: Normal rate and regular rhythm.     Heart sounds: Normal heart sounds. No murmur heard.  No friction rub. No gallop.   Pulmonary:     Effort: Pulmonary effort is normal. No respiratory distress.     Breath sounds: Normal breath sounds. No stridor. No wheezing, rhonchi or rales.  Musculoskeletal:     Cervical back: Normal range of motion and neck supple. No rigidity. No muscular tenderness.  Neurological:     General: No focal deficit present.     Mental Status: He is alert and oriented to person, place, and time.  Psychiatric:        Mood and Affect: Mood normal.        Behavior: Behavior normal.        Thought Content: Thought content normal.     Results for orders placed or performed during the hospital encounter of 11/02/19 (from the past 24 hour(s))  POCT Rapid Strep A (ED/UC)     Status: None    Collection Time: 11/02/19  6:16 PM  Result Value Ref Range   Streptococcus, Group A Screen (Direct) NEGATIVE NEGATIVE    Assessment and Plan :   PDMP not reviewed this encounter.  1. Acute non-recurrent sinusitis, unspecified location   2. Sinus drainage   3. Cough   4. Right ear pain   5. Mild persistent asthma without complication     Will start empiric treatment for sinusitis with doxycycline, has penicillin allergy.  In light of his asthma, will also recommend a prednisone course, refilled his albuterol inhaler.  Recommended supportive care otherwise including the use of oral antihistamine, decongestant. Counseled patient on potential for adverse effects with medications prescribed/recommended today, ER and return-to-clinic precautions discussed, patient verbalized understanding.    Wallis Bamberg, [place] 11/02/19 8099

## 2019-11-03 LAB — CULTURE, GROUP A STREP (THRC)

## 2019-11-05 LAB — CULTURE, GROUP A STREP (THRC)

## 2022-10-13 NOTE — Progress Notes (Signed)
 I provided an intervention for the Tobacco Use SDOH domain. The intervention was Counseling

## 2022-10-13 NOTE — Progress Notes (Signed)
 Assessment/Plan:    George Mason was seen today for cough and sinus problem.  Diagnoses and all orders for this visit:  Rhinosinusitis -     doxycycline  (VIBRAMYCIN ) 100 MG capsule; Take 1 capsule (100 mg total) by mouth two (2) times a day.  Persistent cough  Other orders -     methylPREDNISolone (MEDROL DOSEPACK) 4 mg tablet; Take 1 tablet (4 mg total) by mouth daily. follow package directions -     brompheniramine-pseudoephedrine-DM 2-30-10 mg/5 mL syrup; Take 10 mL by mouth four (4) times a day as needed.   Overall exam is reassuring.  Oropharynx is clear.   Nares clear.  Lungs are clear to auscultation.  No adventitious lung sounds noted.  Vital signs are stable and reassuring.  Patient is afebrile and in no acute distress.  Suspect acute viral illness.  Provided list of treatment options to improve symptoms.  Patient will continue to monitor symptoms for the next few days.  Provided a delayed use antibiotic to use in event of worsening symptoms.  Patient will follow-up with PCP if there is no improvement over the next week.  PHQ-2 Score: 0  PHQ-9 Score:    Edinburgh Score:    Screening complete, no depression identified / no further action needed today  Follow-up as Needed    Subjective:    Patient ID: George Mason is a 37 y.o. male who  is being seen for  Chief Complaint  Patient presents with  . Cough  . Sinus Problem    Has been taking theraflu otc. Went to [place] with a few friends, all have been dx with sinus infections since returning home.     HPI:   HPI Patient is a 37 year old male who presents to clinic for evaluation of a cough.  His symptoms began 5 days ago.  Initially he complained of nasal congestion, sinus pressure, nasal drainage.  Over time he developed a cough.  He took TheraFlu but notes his symptoms continued.  The cough is largely nonproductive but this persistent and frequent throughout the day.  Dyspnea can occur with exertion he  denies dysphagia, posttussive emesis.  He has experienced intermittent chest tightness.  Denies fever, chills, body aches.  Past Medical/Surgical and Social History:   Past Medical History:  Diagnosis Date  . Asthma    Past Surgical History:  Procedure Laterality Date  . ruptured ear drum        Allergies:   Penicillins and Augmentin [amoxicillin-pot clavulanate]  Current Medications:   Current Outpatient Medications  Medication Sig Dispense Refill  . brompheniramine-pseudoephedrine-DM 2-30-10 mg/5 mL syrup Take 10 mL by mouth four (4) times a day as needed. 120 mL 0  . doxycycline  (VIBRAMYCIN ) 100 MG capsule Take 1 capsule (100 mg total) by mouth two (2) times a day. 14 capsule 0  . methylPREDNISolone (MEDROL DOSEPACK) 4 mg tablet Take 1 tablet (4 mg total) by mouth daily. follow package directions 1 each 0   No current facility-administered medications for this visit.    I have reviewed and (if needed) updated the patient's problem list, medications, allergies, past medical and surgical history, preventive services, and social and family history. ROS:   See HPI  Vital Signs:   Wt Readings from Last 3 Encounters:  10/13/22 79.4 kg (175 lb)  10/10/19 72.6 kg (160 lb)  04/23/19 72.6 kg (160 lb)   Temp Readings from Last 3 Encounters:  10/13/22 36.5 C (97.7 F) (Tympanic)  10/10/19 37 C (  98.6 F) (Temporal)  04/23/19 36.9 C (98.4 F) (Skin)   BP Readings from Last 3 Encounters:  10/13/22 139/102  10/10/19 140/87  04/23/19 145/91   Pulse Readings from Last 3 Encounters:  10/13/22 94  10/10/19 74  04/23/19 70   Body mass index is 25.84 kg/m.  Objective:    Physical Exam Constitutional:      Appearance: He is well-developed.  HENT:     Head: Normocephalic and atraumatic.     Right Ear: Tympanic membrane normal.     Left Ear: Tympanic membrane normal.     Nose: Nose normal.     Mouth/Throat:     Mouth: Mucous membranes are moist.  Eyes:      Conjunctiva/sclera: Conjunctivae normal.     Pupils: Pupils are equal, round, and reactive to light.  Cardiovascular:     Rate and Rhythm: Normal rate and regular rhythm.     Heart sounds: Normal heart sounds.  Pulmonary:     Effort: Pulmonary effort is normal. No respiratory distress.     Breath sounds: Normal breath sounds.  Skin:    General: Skin is warm.  Neurological:     Mental Status: He is alert and oriented to person, place, and time.  Psychiatric:        Mood and Affect: Mood normal.        Behavior: Behavior normal.       Labs:   No results found for this visit on 10/13/22.   Follow-up:  No follow-ups on file.    BRYAN T EDWARDS   *Patient note was created using Office manager. Any errors in syntax or even information may not have been identified and edited on initial review prior to signing this note.*
